# Patient Record
Sex: Male | Born: 1960 | Race: Black or African American | Hispanic: No | Marital: Married | Smoking: Current every day smoker
Health system: Southern US, Community
[De-identification: ages and names within clinical notes are randomized; demographics above are authoritative.]

## PROBLEM LIST (undated history)

## (undated) HISTORY — PX: FOOT SURGERY: SHX648

## (undated) HISTORY — PX: INGUINAL HERNIA REPAIR: SUR1180

---

## 1998-01-03 ENCOUNTER — Ambulatory Visit: Admission: RE | Admit: 1998-01-03 | Payer: Self-pay | Source: Ambulatory Visit | Admitting: Urology

## 1999-01-11 ENCOUNTER — Emergency Department (HOSPITAL_COMMUNITY): Admission: EM | Admit: 1999-01-11 | Discharge: 1999-01-11 | Payer: Self-pay | Admitting: Emergency Medicine

## 1999-09-13 ENCOUNTER — Emergency Department (HOSPITAL_COMMUNITY): Admission: EM | Admit: 1999-09-13 | Discharge: 1999-09-13 | Payer: Self-pay | Admitting: Emergency Medicine

## 2000-11-21 ENCOUNTER — Ambulatory Visit: Admission: RE | Admit: 2000-11-21 | Payer: Self-pay | Source: Ambulatory Visit | Admitting: Gastroenterology

## 2005-06-15 ENCOUNTER — Emergency Department (HOSPITAL_COMMUNITY): Admission: EM | Admit: 2005-06-15 | Discharge: 2005-06-15 | Payer: Self-pay | Admitting: Emergency Medicine

## 2007-02-01 ENCOUNTER — Ambulatory Visit: Admit: 2007-02-01 | Disposition: A | Discharge status: Home / Self Care | Payer: Self-pay | Source: Ambulatory Visit

## 2009-01-28 ENCOUNTER — Emergency Department (HOSPITAL_COMMUNITY): Admission: EM | Admit: 2009-01-28 | Discharge: 2009-01-28 | Payer: Self-pay | Admitting: Family Medicine

## 2010-03-01 ENCOUNTER — Emergency Department (HOSPITAL_COMMUNITY): Admission: EM | Admit: 2010-03-01 | Discharge: 2010-03-01 | Payer: Self-pay | Admitting: Emergency Medicine

## 2011-01-31 ENCOUNTER — Emergency Department (HOSPITAL_COMMUNITY)
Admission: EM | Admit: 2011-01-31 | Discharge: 2011-01-31 | Disposition: A | Discharge status: Home / Self Care | Payer: Medicare Other | Attending: Emergency Medicine | Admitting: Emergency Medicine

## 2011-01-31 DIAGNOSIS — M62838 Other muscle spasm: Secondary | ICD-10-CM | POA: Insufficient documentation

## 2011-01-31 DIAGNOSIS — M542 Cervicalgia: Secondary | ICD-10-CM | POA: Insufficient documentation

## 2011-08-11 ENCOUNTER — Ambulatory Visit
Admission: RE | Admit: 2011-08-11 | Disposition: A | Discharge status: Home / Self Care | Payer: Self-pay | Source: Ambulatory Visit | Attending: Family Medicine | Admitting: Family Medicine

## 2012-09-29 NOTE — Op Note (Unsigned)
DATE:                                     11/21/2000            PREOPERATIVE DIAGNOSIS:                   Hematochezia.            POSTOPERATIVE DIAGNOSIS:                  Friable external hemorrhoids                                                responsible for hematochezia.            OPERATION:                                Colonoscopy.            ANESTHESIA:                               Demerol 100 mg, Versed 5 mg IV.            PROCEDURE:                                The patient agreed to the risks      of the procedure including but not limited to infection, bleeding,      perforation, surgical repair, medication complications and a 3% miss rate      for the detection of colon cancer.  Then under continuous monitoring of      blood pressure, pulse oxygen saturation, and after IV sedation, the      colonoscope was inserted into the rectum under direct vision.  Friable      external hemorrhoids were noted responsible for hematochezia.  Retroflexed      view of the rectum revealed normal mucosa.  Upon straightening the      colonoscope, the remainder of the rectum, sigmoid, descending, transverse,      ascending and cecal regions of the colon revealed normal mucosa.  The cecum      was identified via identification of the ileocecal valve and appendiceal      orifice.  Upon slow and careful circumferential withdrawal of the      colonoscope, no masses, polyps or other lesions were noted.  The patient      tolerated the procedure well and was transferred to the recovery room in      stable condition without complications.  Withdrawal process of the      colonoscope from the cecum to the end of the procedure lasted      8 minutes.            IMPRESSION:                               Friable external hemorrhoids      responsible for hematochezia.  PLAN:      1.  Citrucel 2 tablets b.i.d.      2.  Colonoscopy in 10 years unless symptoms.      3.  The patient will call my office if bleeding recurs  regularly.            Dear Hessie Diener, thank you for involving me in the care of this very kind      gentleman.  It is always quite a pleasure to be involved in the care of      your patients.                                    LAL:2lm      Dictated:      11/21/2000 1:44 P             ______________________________      _____      Transcribed:   11/22/2000  2:43 P            Derrek Monaco, MD      Document Number: [id number]            CC:   Derrek Monaco, MD            Almyra Deforest, MD

## 2013-11-20 ENCOUNTER — Encounter (HOSPITAL_COMMUNITY): Payer: Self-pay | Admitting: Emergency Medicine

## 2013-11-20 ENCOUNTER — Emergency Department (HOSPITAL_COMMUNITY): Payer: Medicare Other

## 2013-11-20 ENCOUNTER — Emergency Department (HOSPITAL_COMMUNITY)
Admission: EM | Admit: 2013-11-20 | Discharge: 2013-11-20 | Disposition: A | Discharge status: Home / Self Care | Payer: Medicare Other | Attending: Emergency Medicine | Admitting: Emergency Medicine

## 2013-11-20 DIAGNOSIS — J40 Bronchitis, not specified as acute or chronic: Secondary | ICD-10-CM

## 2013-11-20 DIAGNOSIS — F17200 Nicotine dependence, unspecified, uncomplicated: Secondary | ICD-10-CM | POA: Insufficient documentation

## 2013-11-20 DIAGNOSIS — J209 Acute bronchitis, unspecified: Secondary | ICD-10-CM | POA: Insufficient documentation

## 2013-11-20 DIAGNOSIS — H5710 Ocular pain, unspecified eye: Secondary | ICD-10-CM | POA: Insufficient documentation

## 2013-11-20 LAB — POCT I-STAT, CHEM 8
BUN: 9 mg/dL (ref 6–23)
CALCIUM ION: 1.18 mmol/L (ref 1.12–1.23)
CHLORIDE: 99 meq/L (ref 96–112)
CREATININE: 1.1 mg/dL (ref 0.50–1.35)
Glucose, Bld: 102 mg/dL — ABNORMAL HIGH (ref 70–99)
HCT: 47 % (ref 39.0–52.0)
HEMOGLOBIN: 16 g/dL (ref 13.0–17.0)
POTASSIUM: 3.8 meq/L (ref 3.7–5.3)
Sodium: 138 mEq/L (ref 137–147)
TCO2: 26 mmol/L (ref 0–100)

## 2013-11-20 LAB — CBC WITH DIFFERENTIAL/PLATELET
Basophils Absolute: 0 10*3/uL (ref 0.0–0.1)
Basophils Relative: 1 % (ref 0–1)
EOS PCT: 3 % (ref 0–5)
Eosinophils Absolute: 0.1 10*3/uL (ref 0.0–0.7)
HEMATOCRIT: 42.7 % (ref 39.0–52.0)
HEMOGLOBIN: 14.8 g/dL (ref 13.0–17.0)
LYMPHS ABS: 0.7 10*3/uL (ref 0.7–4.0)
Lymphocytes Relative: 21 % (ref 12–46)
MCH: 31.5 pg (ref 26.0–34.0)
MCHC: 34.7 g/dL (ref 30.0–36.0)
MCV: 90.9 fL (ref 78.0–100.0)
Monocytes Absolute: 0.5 10*3/uL (ref 0.1–1.0)
Monocytes Relative: 15 % — ABNORMAL HIGH (ref 3–12)
Neutro Abs: 2 10*3/uL (ref 1.7–7.7)
Neutrophils Relative %: 60 % (ref 43–77)
PLATELETS: 139 10*3/uL — AB (ref 150–400)
RBC: 4.7 MIL/uL (ref 4.22–5.81)
RDW: 12.9 % (ref 11.5–15.5)
WBC: 3.2 10*3/uL — ABNORMAL LOW (ref 4.0–10.5)

## 2013-11-20 MED ORDER — ALBUTEROL SULFATE HFA 108 (90 BASE) MCG/ACT IN AERS: 2.0000 | INHALATION_SPRAY | RESPIRATORY_TRACT | Status: DC

## 2013-11-20 MED ORDER — PREDNISONE 20 MG PO TABS
60.0000 mg | ORAL_TABLET | Freq: Once | ORAL | Status: AC
  Administered 2013-11-20: 60 mg via ORAL
  Filled 2013-11-20: qty 3

## 2013-11-20 MED ORDER — ACETAMINOPHEN 325 MG PO TABS
650.0000 mg | ORAL_TABLET | Freq: Once | ORAL | Status: AC
  Administered 2013-11-20: 650 mg via ORAL
  Filled 2013-11-20: qty 2

## 2013-11-20 MED ORDER — ALBUTEROL SULFATE (2.5 MG/3ML) 0.083% IN NEBU
5.0000 mg | INHALATION_SOLUTION | Freq: Once | RESPIRATORY_TRACT | Status: AC
  Administered 2013-11-20: 5 mg via RESPIRATORY_TRACT
  Filled 2013-11-20: qty 6

## 2013-11-20 MED ORDER — PREDNISONE 10 MG PO TABS: ORAL_TABLET | ORAL | Status: DC

## 2013-11-20 MED ORDER — AZITHROMYCIN 250 MG PO TABS: 250.0000 mg | ORAL_TABLET | Freq: Every day | ORAL | Status: DC

## 2013-11-20 NOTE — ED Provider Notes (Signed)
CSN: [id number]     Arrival date & time 11/20/13  0756 History   First MD Initiated Contact with Patient 11/20/13 0800     Chief Complaint  Patient presents with  . Cough     (Consider location/radiation/quality/duration/timing/severity/associated sxs/prior Treatment) HPI Tommy Barry is a 53 y.o. male who presents to emergency department complaining of cough and fever. Patient states that his symptoms began 2 days ago. Patient states it began with nasal congestion and cough. States nasal congestion improved, now just having productive cough with clear and brownish sputum. States now also having pain in the rib cage with coughing. He denies any known fever but states he has had chills. He denies any sore throat. He denies any headache, neck pain or stiffness, nausea, vomiting, diarrhea, abdominal pain. He states he has pain with behind his eyes. He denies any shortness of breath. He tried to take a (NyQuil for his symptoms with no relief. He is a smoker, denies any other medical problems. He does not take any other medications. He states nothing makes his symptoms better or worse.  History reviewed. No pertinent past medical history. Past Surgical History  Procedure Laterality Date  . Foot surgery      right   No family history on file. History  Substance Use Topics  . Smoking status: Current Every Day Smoker    Types: Cigarettes  . Smokeless tobacco: Not on file  . Alcohol Use: Yes    Review of Systems  Constitutional: Negative for fever and chills.  HENT: Positive for congestion. Negative for facial swelling, mouth sores, sinus pressure, sore throat and trouble swallowing.   Eyes: Positive for pain. Negative for photophobia.  Respiratory: Positive for cough. Negative for chest tightness and shortness of breath.   Cardiovascular: Positive for chest pain. Negative for palpitations and leg swelling.  Gastrointestinal: Negative for nausea, vomiting, abdominal pain, diarrhea and  abdominal distention.  Genitourinary: Negative for dysuria, urgency, frequency and hematuria.  Musculoskeletal: Negative for arthralgias, myalgias, neck pain and neck stiffness.  Skin: Negative for rash.  Allergic/Immunologic: Negative for immunocompromised state.  Neurological: Negative for dizziness, weakness, light-headedness, numbness and headaches.      Allergies  Shellfish allergy  Home Medications   Current Outpatient Rx  Name  Route  Sig  Dispense  Refill  . Pseudoeph-Doxylamine-DM-APAP (NYQUIL PO)   Oral   Take 2 tablets by mouth every 4 (four) hours as needed (cold).         . Pseudoephedrine-APAP-DM (DAYQUIL PO)   Oral   Take 30 mLs by mouth daily as needed (cold).          BP 138/90  Pulse 102  Temp(Src) 101.2 F (38.4 C) (Oral)  Resp 30  Ht 6\' 4"  (1.93 m)  Wt 160 lb (72.576 kg)  BMI 19.48 kg/m2  SpO2 97% Physical Exam  Nursing note and vitals reviewed. Constitutional: He is oriented to person, place, and time. He appears well-developed and well-nourished. No distress.  HENT:  Head: Normocephalic and atraumatic.  Right Ear: External ear normal.  Left Ear: External ear normal.  Nose: Nose normal.  Mouth/Throat: Oropharynx is clear and moist.  Eyes: Conjunctivae are normal.  Neck: Neck supple.  Cardiovascular: Normal rate, regular rhythm and normal heart sounds.   Pulmonary/Chest: Effort normal. No respiratory distress. He has no wheezes. He has no rales.  Abdominal: Soft. Bowel sounds are normal. He exhibits no distension. There is no tenderness. There is no rebound.  Musculoskeletal: He exhibits no  edema.  Neurological: He is alert and oriented to person, place, and time.  Skin: Skin is warm and dry.    ED Course  Procedures (including critical care time) Labs Review Labs Reviewed  CBC WITH DIFFERENTIAL - Abnormal; Notable for the following:    WBC 3.2 (*)    Platelets 139 (*)    Monocytes Relative 15 (*)    All other components within  normal limits  POCT I-STAT, CHEM 8 - Abnormal; Notable for the following:    Glucose, Bld 102 (*)    All other components within normal limits   Imaging Review Dg Chest 2 View  11/20/2013   CLINICAL DATA:  Cough  EXAM: CHEST  2 VIEW  COMPARISON:  None.  FINDINGS: Mild biapical pleural parenchymal scarring. Large retrosternal clear space with suspicion for underlying COPD. Airway thickening is present, suggesting bronchitis or reactive airways disease. Cardiac and mediastinal margins appear normal. No pleural effusion.  IMPRESSION: 1. Airway thickening is present, suggesting bronchitis or reactive airways disease. 2. Emphysema.   Electronically Signed   By: Herbie Baltimore M.D.   On: 11/20/2013 09:06    EKG Interpretation   None       MDM   Final diagnoses:  Bronchitis     Patient with cough, congestion, fever for 2 days. Pt is febrile here at 101.2, tylenol given. His lungs are clear. CXR negative except for airway thickening, most likely bronchitis. . Pt non toxic appearing. He has no other medical problems. He is a smoker. Given prednisone 60mg  PO in ED and a neb, which helped. Pt is maintaining his oxygen sat above 96% on RA.  His HR and BP are normal. At this time, no indication for admission. At this time differential includes influenza, viral vs bacterial bronchitis. Doubt ACS or PE given clear upper respiratory symptoms, cough, fever. His CP is worsened with cough. Home with prednisone taper, inhaler, z-pack given a smoker and most likely COPD. Follow up with pcp.    Filed Vitals:   11/20/13 0803 11/20/13 0813 11/20/13 0915 11/20/13 1000  BP: 138/90   125/81  Pulse: 102  88 87  Temp: 101.2 F (38.4 C)   99.5 F (37.5 C)  TempSrc: Oral   Oral  Resp: 30  19 20   Height:  6\' 4"  (1.93 m)    Weight:  160 lb (72.576 kg)    SpO2: 97%  98% 98%      Daphna Lafuente A Alisan Dokes, PA-C 11/20/13 1606

## 2013-11-20 NOTE — ED Notes (Signed)
Care transferred, report received Marylene LandAngela, RN.

## 2013-11-20 NOTE — ED Notes (Signed)
Congested, non=-productive cough since Sunday,  Painful to his ribs due to coughing. Pain behind his eyes  Denies any N/V//D  Also having fever and chills.

## 2013-11-20 NOTE — Discharge Instructions (Signed)
Take tylenol every 6 hrs for fever. You can also take motrin for extra fever reducing. Use inhaler 2 puffs every 4 hrs as needed for cough and SOB. Take prdnisone as prescribed until all gone. Take zithromax as prescribed until all gone. Follow up with your doctor. Return here if not improving.    Bronchitis Bronchitis is inflammation of the airways that extend from the windpipe into the lungs (bronchi). The inflammation often causes mucus to develop, which leads to a cough. If the inflammation becomes severe, it may cause shortness of breath. CAUSES  Bronchitis may be caused by:   Viral infections.   Bacteria.   Cigarette smoke.   Allergens, pollutants, and other irritants.  SIGNS AND SYMPTOMS  The most common symptom of bronchitis is a frequent cough that produces mucus. Other symptoms include:  Fever.   Body aches.   Chest congestion.   Chills.   Shortness of breath.   Sore throat.  DIAGNOSIS  Bronchitis is usually diagnosed through a medical history and physical exam. Tests, such as chest X-rays, are sometimes done to rule out other conditions.  TREATMENT  You may need to avoid contact with whatever caused the problem (smoking, for example). Medicines are sometimes needed. These may include:  Antibiotics. These may be prescribed if the condition is caused by bacteria.  Cough suppressants. These may be prescribed for relief of cough symptoms.   Inhaled medicines. These may be prescribed to help open your airways and make it easier for you to breathe.   Steroid medicines. These may be prescribed for those with recurrent (chronic) bronchitis. HOME CARE INSTRUCTIONS  Get plenty of rest.   Drink enough fluids to keep your urine clear or pale yellow (unless you have a medical condition that requires fluid restriction). Increasing fluids may help thin your secretions and will prevent dehydration.   Only take over-the-counter or prescription medicines as  directed by your health care provider.  Only take antibiotics as directed. Make sure you finish them even if you start to feel better.  Avoid secondhand smoke, irritating chemicals, and strong fumes. These will make bronchitis worse. If you are a smoker, quit smoking. Consider using nicotine gum or skin patches to help control withdrawal symptoms. Quitting smoking will help your lungs heal faster.   Put a cool-mist humidifier in your bedroom at night to moisten the air. This may help loosen mucus. Change the water in the humidifier daily. You can also run the hot water in your shower and sit in the bathroom with the door closed for 5 10 minutes.   Follow up with your health care provider as directed.   Wash your hands frequently to avoid catching bronchitis again or spreading an infection to others.  SEEK MEDICAL CARE IF: Your symptoms do not improve after 1 week of treatment.  SEEK IMMEDIATE MEDICAL CARE IF:  Your fever increases.  You have chills.   You have chest pain.   You have worsening shortness of breath.   You have bloody sputum.  You faint.  You have lightheadedness.  You have a severe headache.   You vomit repeatedly. MAKE SURE YOU:   Understand these instructions.  Will watch your condition.  Will get help right away if you are not doing well or get worse. Document Released: 09/27/2005 Document Revised: 07/18/2013 Document Reviewed: 05/22/2013 [facility]ExitCare Patient Information 2014 [place], [place].

## 2013-11-23 NOTE — ED Provider Notes (Signed)
  Medical screening examination/treatment/procedure(s) were performed by non-physician practitioner and as supervising physician I was immediately available for consultation/collaboration.     Gerhard Munchobert Brittiany Wiehe, MD 11/23/13 [phone]

## 2014-04-09 ENCOUNTER — Emergency Department (HOSPITAL_COMMUNITY): Payer: Medicare Other

## 2014-04-09 ENCOUNTER — Emergency Department (HOSPITAL_COMMUNITY)
Admission: EM | Admit: 2014-04-09 | Discharge: 2014-04-09 | Disposition: A | Discharge status: Home / Self Care | Payer: Medicare Other | Attending: Emergency Medicine | Admitting: Emergency Medicine

## 2014-04-09 ENCOUNTER — Encounter (HOSPITAL_COMMUNITY): Payer: Self-pay | Admitting: Emergency Medicine

## 2014-04-09 DIAGNOSIS — G8929 Other chronic pain: Secondary | ICD-10-CM | POA: Insufficient documentation

## 2014-04-09 DIAGNOSIS — Z792 Long term (current) use of antibiotics: Secondary | ICD-10-CM | POA: Insufficient documentation

## 2014-04-09 DIAGNOSIS — M79609 Pain in unspecified limb: Secondary | ICD-10-CM | POA: Insufficient documentation

## 2014-04-09 DIAGNOSIS — M79644 Pain in right finger(s): Secondary | ICD-10-CM

## 2014-04-09 DIAGNOSIS — F17200 Nicotine dependence, unspecified, uncomplicated: Secondary | ICD-10-CM | POA: Insufficient documentation

## 2014-04-09 MED ORDER — MELOXICAM 7.5 MG PO TABS: 7.5000 mg | ORAL_TABLET | Freq: Every day | ORAL | Status: DC | PRN

## 2014-04-09 NOTE — Discharge Instructions (Signed)
Read the information below.  Use the prescribed medication as directed.  Please discuss all new medications with your pharmacist.  You may return to the Emergency Department at any time for worsening condition or any new symptoms that concern you.  If you develop uncontrolled pain, weakness or numbness of the extremity, severe discoloration of the skin, or you are unable to move your finger, return to the ER for a recheck.      Musculoskeletal Pain Musculoskeletal pain is muscle and boney aches and pains. These pains can occur in any part of the body. Your caregiver may treat you without knowing the cause of the pain. They may treat you if blood or urine tests, X-rays, and other tests were normal.  CAUSES There is often not a definite cause or reason for these pains. These pains may be caused by a type of germ (virus). The discomfort may also come from overuse. Overuse includes working out too hard when your body is not fit. Boney aches also come from weather changes. Bone is sensitive to atmospheric pressure changes. HOME CARE INSTRUCTIONS   Ask when your test results will be ready. Make sure you get your test results.  Only take over-the-counter or prescription medicines for pain, discomfort, or fever as directed by your caregiver. If you were given medications for your condition, do not drive, operate machinery or power tools, or sign legal documents for 24 hours. Do not drink alcohol. Do not take sleeping pills or other medications that may interfere with treatment.  Continue all activities unless the activities cause more pain. When the pain lessens, slowly resume normal activities. Gradually increase the intensity and duration of the activities or exercise.  During periods of severe pain, bed rest may be helpful. Lay or sit in any position that is comfortable.  Putting ice on the injured area.  Put ice in a bag.  Place a towel between your skin and the bag.  Leave the ice on for 15 to 20  minutes, 3 to 4 times a day.  Follow up with your caregiver for continued problems and no reason can be found for the pain. If the pain becomes worse or does not go away, it may be necessary to repeat tests or do additional testing. Your caregiver may need to look further for a possible cause. SEEK IMMEDIATE MEDICAL CARE IF:  You have pain that is getting worse and is not relieved by medications.  You develop chest pain that is associated with shortness or breath, sweating, feeling sick to your stomach (nauseous), or throw up (vomit).  Your pain becomes localized to the abdomen.  You develop any new symptoms that seem different or that concern you. MAKE SURE YOU:   Understand these instructions.  Will watch your condition.  Will get help right away if you are not doing well or get worse. Document Released: 09/27/2005 Document Revised: 12/20/2011 Document Reviewed: 06/01/2013 [facility]Exit[facility] Patient Information 2015 [place], [place]. This information is not intended to replace advice given to you by your health care provider. Make sure you discuss any questions you have with your health care provider.

## 2014-04-09 NOTE — ED Provider Notes (Signed)
CSN: [id number]     Arrival date & time 04/09/14  1106 History  This chart was scribed for non-physician practitioner, Trixie DredgeEmily West, PA-C, working with Ethelda ChickMartha K Linker, MD by Shari HeritageAisha Amuda, ED Scribe. This patient was seen in room TR05C/TR05C and the patient's care was started at 1:29 PM.   Chief Complaint  Patient presents with  . Finger Injury    The history is provided by the patient. No language interpreter was used.    HPI Comments: Tommy BaileyMark Erman is a 53 y.o. male who presents to the Emergency Department complaining of constant, moderate right index finger pain onset 2 weeks ago. He rates pain as 6/10. He describes pain as soreness. Pain is worse after use of hand for multiple hours. He has no taken any medications at home for symptom relief. 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 He reports intermittent tingling in his right hand. He denies numbness or weakness of the extremities, fever, chills, nausea or vomiting. He has no history of pulmonary disease and denies any other chronic medical conditions.    History reviewed. No pertinent past medical history. Past Surgical History  Procedure Laterality Date  . Foot surgery      right   No family history on file. History  Substance Use Topics  . Smoking status: Current Every Day Smoker    Types: Cigarettes  . Smokeless tobacco: Not on file  . Alcohol Use: Yes    Review of Systems  Constitutional: Negative for fever and chills.  Gastrointestinal: Negative for nausea and vomiting.  Musculoskeletal: Positive for arthralgias (right 2nd finger).  Neurological: Negative for weakness and numbness.  All other systems reviewed and are negative.     Allergies  Shellfish allergy  Home Medications   Prior to Admission medications   Medication Sig Start Date End Date Taking? Authorizing Rande Dario  azithromycin (ZITHROMAX) 250 MG tablet Take 1 tablet  (250 mg total) by mouth daily. Take first 2 tablets together, then 1 every day until finished. 11/20/13   Tatyana A Kirichenko, PA-C  predniSONE (DELTASONE) 10 MG tablet Take 5 tab day 1, take 4 tab day 2, take 3 tab day 3, take 2 tab day 4, and take 1 tab day 5 11/20/13   Tatyana A Kirichenko, PA-C  Pseudoeph-Doxylamine-DM-APAP (NYQUIL PO) Take 2 tablets by mouth every 4 (four) hours as needed (cold).    Historical Rosalva Neary, MD  Pseudoephedrine-APAP-DM (DAYQUIL PO) Take 30 mLs by mouth daily as needed (cold).    Historical Eudell Mcphee, MD   Triage Vitals: BP 140/89  Pulse 73  Temp(Src) 98 F (36.7 C) (Oral)  Resp 20  SpO2 98%  Physical Exam  Nursing note and vitals reviewed. Constitutional: He appears well-developed and well-nourished. No distress.  HENT:  Head: Normocephalic and atraumatic.  Neck: Neck supple.  Pulmonary/Chest: Effort normal.  Musculoskeletal:  Right upper extremity: Right forearm is nontender. Intact radial pulse on the right. Capillary refill less than 2 seconds throughout. Full active ROM of all digits, except for the DIP of the 2nd digit which does not bend chronically. Mild tenderness to the palmar and dorsal aspects of the 2nd digit starting at the mid metacarpal, worse over the MCP. No edema, erythema or warmth.    Neurological: He is alert.  Skin: He is not diaphoretic.    ED Course  Procedures (including critical care time) DIAGNOSTIC STUDIES: Oxygen Saturation is 98%  on room air, normal by my interpretation.    COORDINATION OF CARE: 1:33 PM- Patient informed of current plan for treatment and evaluation and agrees with plan at this time.   Imaging Review Dg Hand Complete Right  04/09/2014   CLINICAL DATA:  Right hand pain  EXAM: RIGHT HAND - COMPLETE 3+ VIEW  COMPARISON:  None.  FINDINGS: There is no evidence of fracture or dislocation. There is a small osseous protuberance along the dorsal aspect of the tuft of the right third digit. There is no evidence of  arthropathy or other focal bone abnormality. Soft tissues are unremarkable.  IMPRESSION: No acute osseous injury of the right hand.   Electronically Signed   By: Elige KoHetal  Patel   On: 04/09/2014 13:06    MDM   Final diagnoses:  Finger pain, right    Pt with chronic finger pain on right hand, worse with repetitive movements at work.  Suspect tendonitis.  No e/o infection.  No specific injury of hand.  Neurovascularly intact.  D/C home with mobic, hand surgery follow up.  Discussed result, findings, treatment, and follow up  with patient.  Pt given return precautions.  Pt verbalizes understanding and agrees with plan.      I personally performed the services described in this documentation, which was scribed in my presence. The recorded information has been reviewed and is accurate.   Trixie Dredgemily West, PA-C 04/09/14 1538

## 2014-04-09 NOTE — ED Notes (Signed)
Pt reports right index finger pain for several weeks. Pushes a cart for a living. ROM intact

## 2014-04-12 NOTE — ED Provider Notes (Signed)
Medical screening examination/treatment/procedure(s) were performed by non-physician practitioner and as supervising physician I was immediately available for consultation/collaboration.   EKG Interpretation None       Ethelda ChickMartha K Linker, MD 04/12/14 [phone]

## 2015-02-16 IMAGING — CR DG HAND COMPLETE 3+V*R*
3 series · 3 of 3 positions shown · non-contrast
Comparison: None.

CLINICAL DATA: Right hand pain

EXAM:
RIGHT HAND - COMPLETE 3+ VIEW

[x hand pa right]
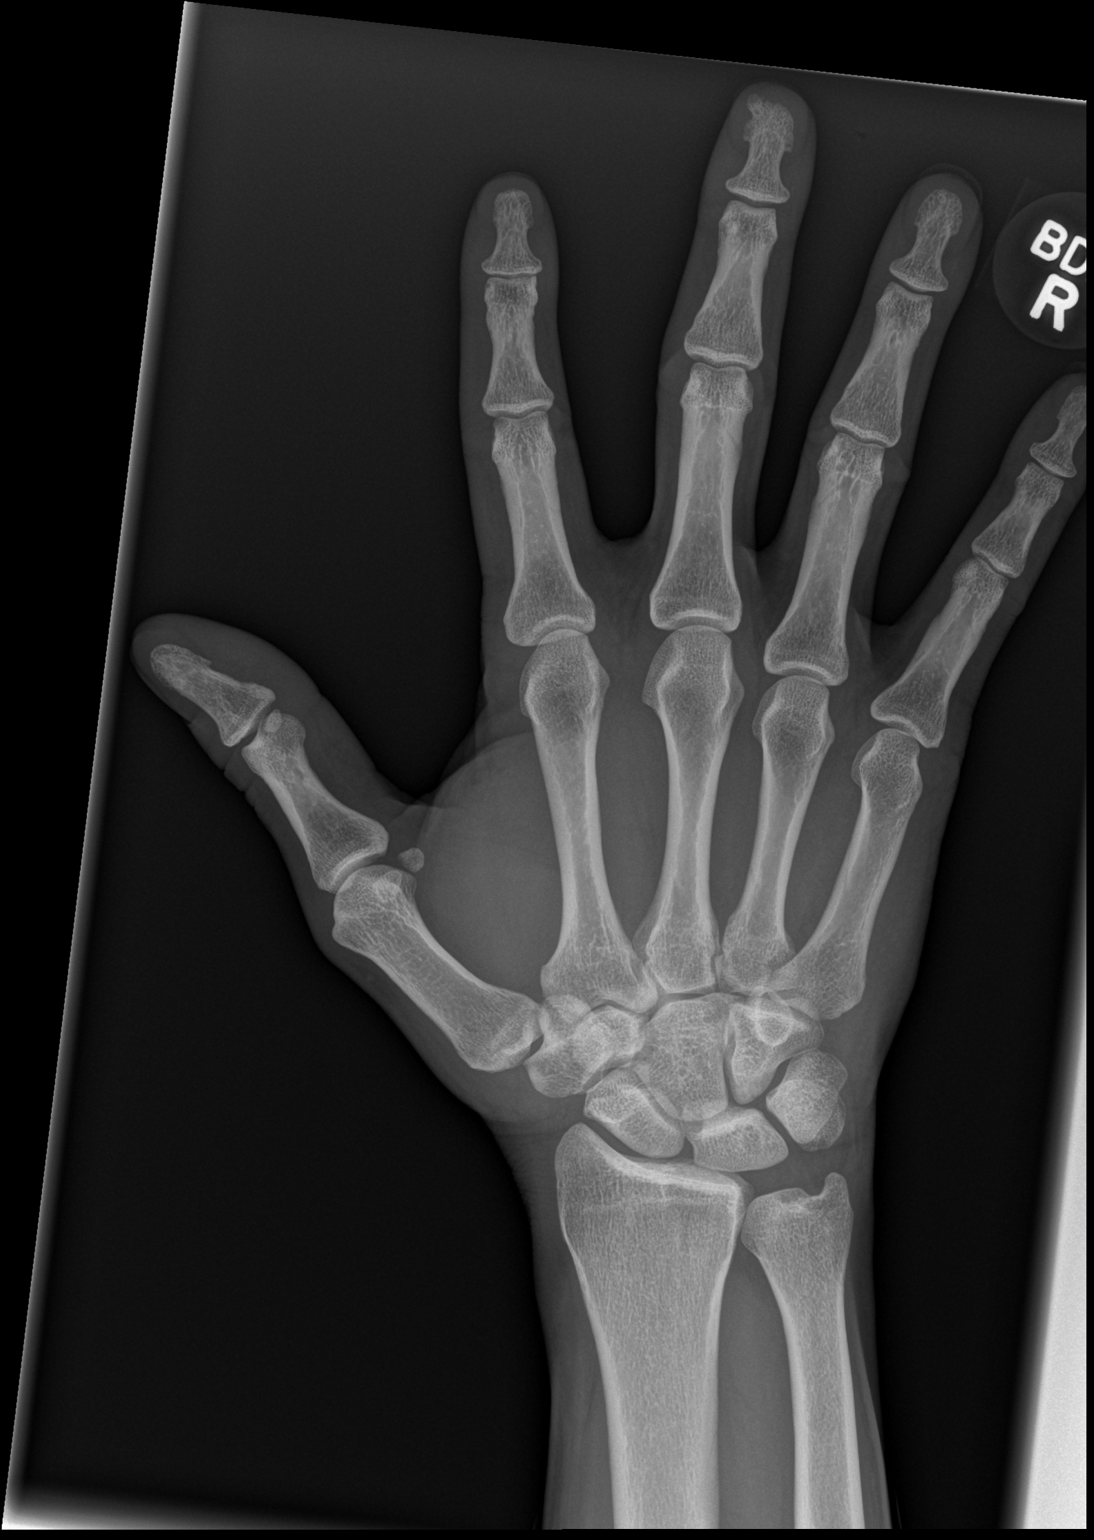

[x hand obl right]
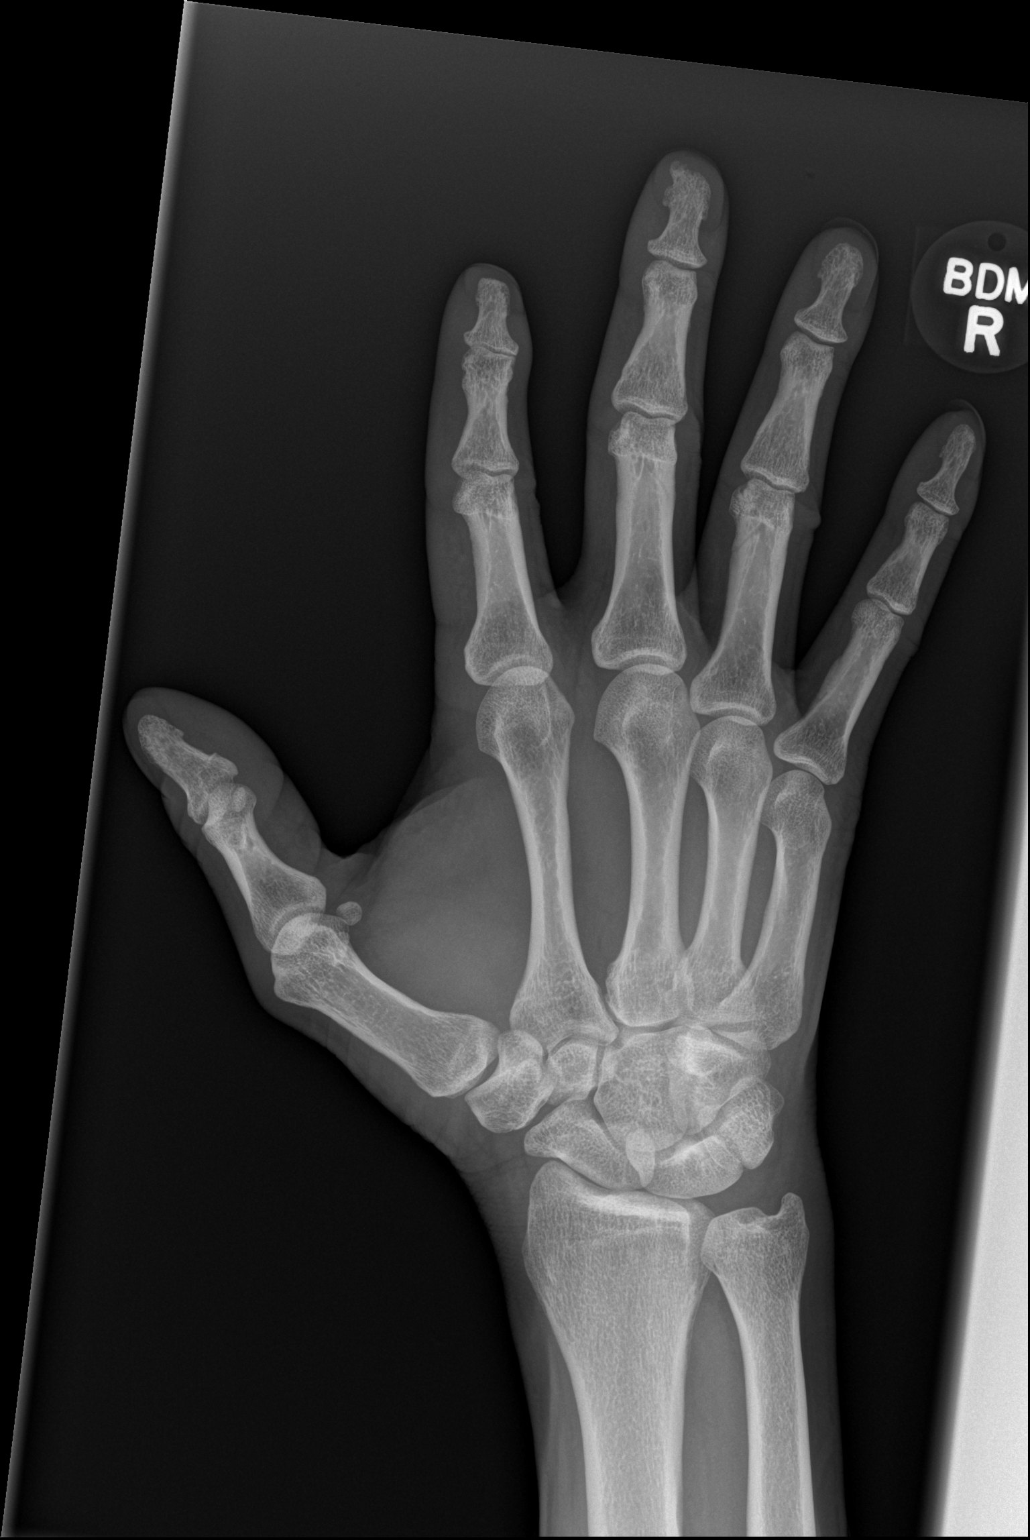

[x hand lat right]
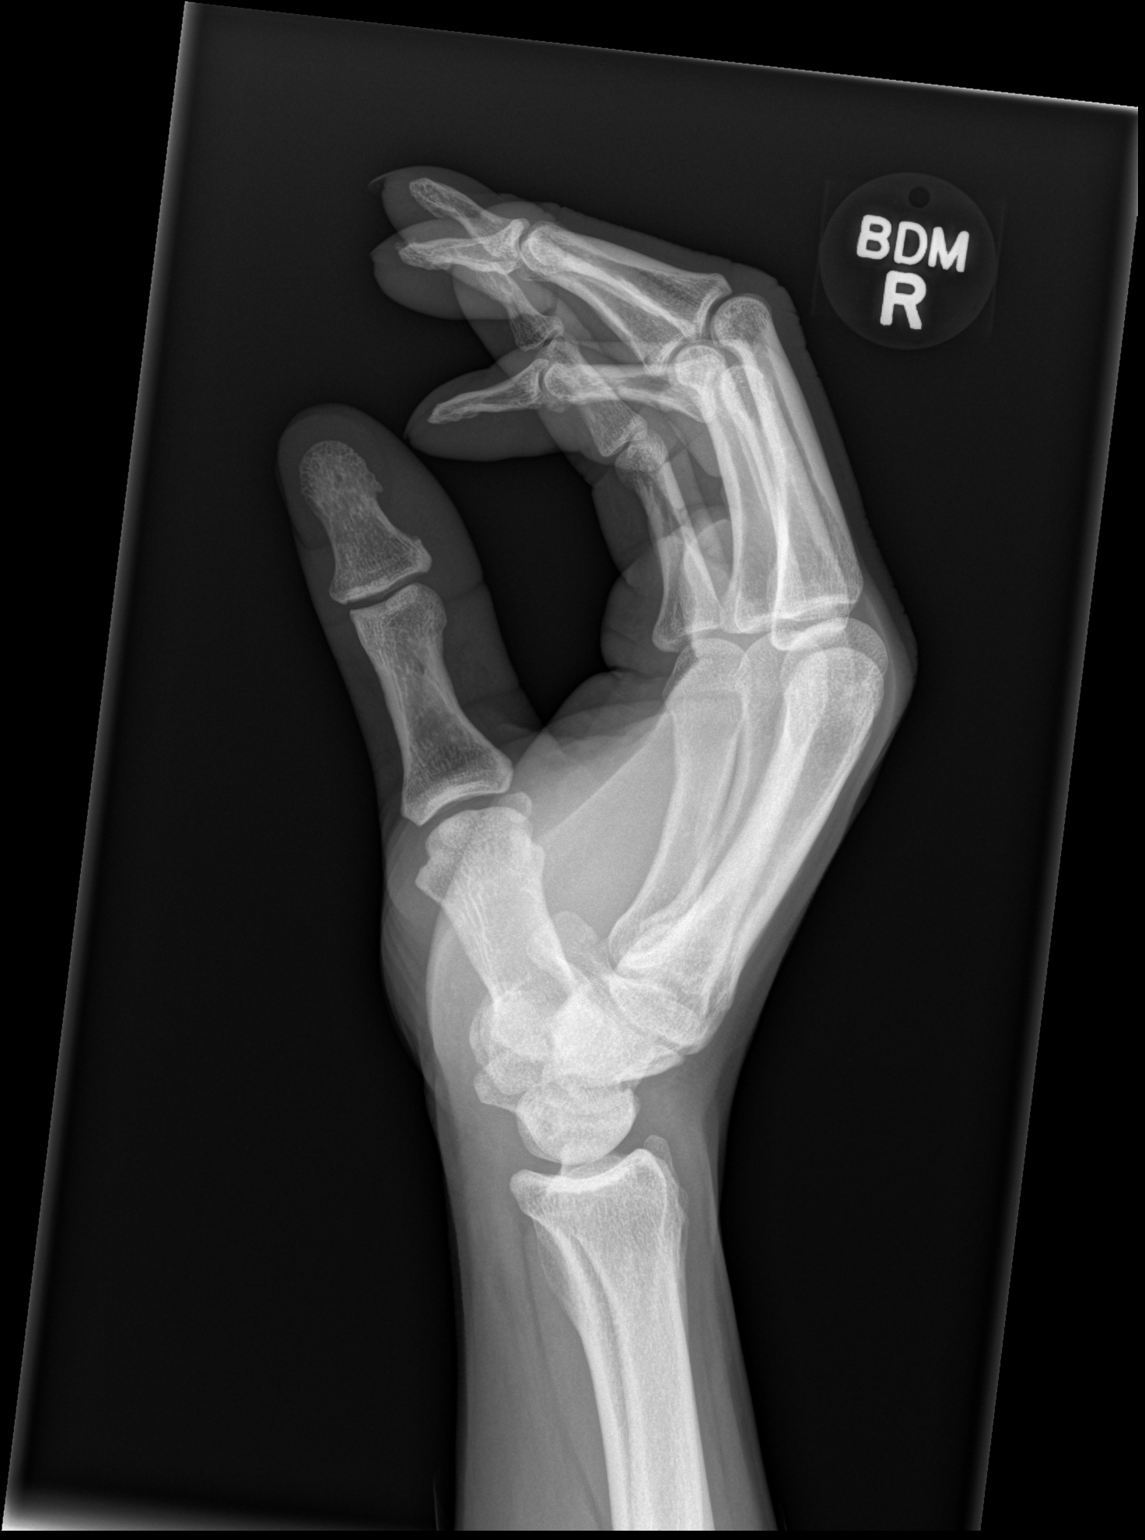

[3 of 3 positions shown; findings below may reference images not displayed]

FINDINGS: There is no evidence of fracture or dislocation. There is a small
osseous protuberance along the dorsal aspect of the tuft of the
right third digit. There is no evidence of arthropathy or other
focal bone abnormality. Soft tissues are unremarkable.
IMPRESSION: No acute osseous injury of the right hand.

## 2017-06-28 ENCOUNTER — Emergency Department (HOSPITAL_COMMUNITY)
Admission: EM | Admit: 2017-06-28 | Discharge: 2017-06-28 | Disposition: A | Discharge status: Home / Self Care | Payer: Medicare Other | Attending: Emergency Medicine | Admitting: Emergency Medicine

## 2017-06-28 ENCOUNTER — Encounter (HOSPITAL_COMMUNITY): Payer: Self-pay | Admitting: *Deleted

## 2017-06-28 DIAGNOSIS — F17210 Nicotine dependence, cigarettes, uncomplicated: Secondary | ICD-10-CM | POA: Diagnosis not present

## 2017-06-28 DIAGNOSIS — M25521 Pain in right elbow: Secondary | ICD-10-CM | POA: Diagnosis not present

## 2017-06-28 DIAGNOSIS — M791 Myalgia: Secondary | ICD-10-CM | POA: Diagnosis not present

## 2017-06-28 DIAGNOSIS — M2550 Pain in unspecified joint: Secondary | ICD-10-CM | POA: Diagnosis not present

## 2017-06-28 MED ORDER — PREDNISONE 10 MG PO TABS: 40.0000 mg | ORAL_TABLET | Freq: Every day | ORAL | 0 refills | Status: AC

## 2017-06-28 NOTE — Discharge Instructions (Signed)
Please read attached information regarding your condition. Take prednisone 40 mg for 5 days. Follow-up at [facility] for further evaluation. Return to ED for injuries, falls, numbness in arm, fevers, increased joint swelling or signs of infection in joint.

## 2017-06-28 NOTE — ED Triage Notes (Signed)
To ED for eval of right elbow ache and weakness for the past 2 weeks. Ache runs from fingers to shoulder. Pt able to move arm without difficulty in triage. No neuro deficits noted.

## 2017-06-28 NOTE — ED Provider Notes (Signed)
[facility]-EMERGENCY DEPT Provider Note   CSN: [id number] Arrival date & time: 06/28/17  1116     History   Chief Complaint Chief Complaint  Patient presents with  . Elbow Pain    HPI Tommy Barry is a 56 y.o. male.  HPI Patient presents to ED for evaluation of right arm pain for the past 6 weeks. States that the symptoms have gotten worse in the past few days. He reports pain and aches throughout the entire arm. 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 He denies any medications prior to arrival to help with pain. He denies any falls, injuries, numbness, weakness, prior fracture, dislocation or procedure in the area. He denies any chest pain, shortness of breath, abdominal pain.  History reviewed. No pertinent past medical history.  There are no active problems to display for this patient.   Past Surgical History:  Procedure Laterality Date  . FOOT SURGERY     right       Home Medications    Prior to Admission medications   Medication Sig Start Date End Date Taking? Authorizing Provider  azithromycin (ZITHROMAX) 250 MG tablet Take 1 tablet (250 mg total) by mouth daily. Take first 2 tablets together, then 1 every day until finished. 11/20/13   Kirichenko, Lemont Fillers, PA-C  meloxicam (MOBIC) 7.5 MG tablet Take 1 tablet (7.5 mg total) by mouth daily as needed for pain. 04/09/14   Trixie Dredge, PA-C  predniSONE (DELTASONE) 10 MG tablet Take 4 tablets (40 mg total) by mouth daily. 06/28/17 07/03/17  Yasaman Kolek, PA-C  Pseudoeph-Doxylamine-DM-APAP (NYQUIL PO) Take 2 tablets by mouth every 4 (four) hours as needed (cold).    [provider]  Pseudoephedrine-APAP-DM (DAYQUIL PO) Take 30 mLs by mouth daily as needed (cold).    [provider]    Family History No family history on file.  Social History Social History  Substance Use Topics  . Smoking status: Current Every Day Smoker    Types:  Cigarettes  . Smokeless tobacco: Not on file  . Alcohol use Yes     Allergies   Shellfish allergy   Review of Systems Review of Systems  Constitutional: Negative for chills and fever.  Musculoskeletal: Positive for arthralgias and myalgias. Negative for back pain, gait problem, joint swelling and neck pain.  Skin: Negative for pallor and wound.  Neurological: Negative for facial asymmetry, weakness, numbness and headaches.     Physical Exam Updated Vital Signs BP (!) 160/92   Pulse 71   Temp 98.2 F (36.8 C) (Oral)   Resp 16   SpO2 100%   Physical Exam  Constitutional: He appears well-developed and well-nourished. No distress.  Patient is nontoxic-appearing and in no acute distress.  HENT:  Head: Normocephalic and atraumatic.  Eyes: Conjunctivae and EOM are normal. No scleral icterus.  Neck: Normal range of motion.  Pulmonary/Chest: Effort normal. No respiratory distress.  Musculoskeletal: Normal range of motion. He exhibits tenderness. He exhibits no edema or deformity.       Arms: Tenderness to palpation of the entire right arm. No visible deformity, edema, wound or rash noted. Sensation intact to light touch. Equal grip strength bilaterally. Full active and passive range of motion of the shoulder, elbow and wrist of the right side.strength 5/5 in bilateral upper extremities. Negative empty can test. Negative Tinel's and Phalen's test.  Neurological: He is alert.  Skin: No rash  noted. He is not diaphoretic.  Psychiatric: He has a normal mood and affect.  Nursing note and vitals reviewed.    ED Treatments / Results  Labs (all labs ordered are listed, but only abnormal results are displayed) Labs Reviewed - No data to display  EKG  EKG Interpretation None       Radiology No results found.  Procedures Procedures (including critical care time)  Medications Ordered in ED Medications - No data to display   Initial Impression / Assessment and Plan / ED  Course  I have reviewed the triage vital signs and the nursing notes.  Pertinent labs & imaging results that were available during my care of the patient were reviewed by me and considered in my medical decision making (see chart for details).     Patient presents to ED for evaluation of right arm pain and aches that have been going on for the past 6 weeks. He does push carts at work and is unsure if this is causing him to have the pain, and he has been doing this for several years. He denies any weakness, numbness, injuries or falls. On physical exam there is diffuse tenderness to palpation of the arm. No visible deformity, rashes. Strength 5/5 in bilateral upper extremities, sensation intact to light touch. He has negative empty can test, negative Phalen's and Tinel's test. There is no appreciable weakness on physical examination. I suspect that his symptoms could be due to overuse of the extremity. There is no bony tenderness noted and joint abnormalities. We'll give prednisone to be taken to help with inflammation and advise patient to follow up at [facility] for further evaluation. He states that he will follow up with them regarding his blood pressure as well as he has no history of hypertension in the past. Patient appears stable for discharge at this time. Strict return precautions given.  Final Clinical Impressions(s) / ED Diagnoses   Final diagnoses:  Right elbow pain    New Prescriptions Discharge Medication List as of 06/28/2017  2:06 PM       Dietrich Pates, PA-C 06/28/17 1711    Melene Plan, DO 07/01/17 1842

## 2018-03-08 ENCOUNTER — Encounter (HOSPITAL_COMMUNITY): Payer: Self-pay | Admitting: *Deleted

## 2018-03-08 ENCOUNTER — Emergency Department (HOSPITAL_COMMUNITY): Payer: Medicare Other

## 2018-03-08 ENCOUNTER — Emergency Department (HOSPITAL_COMMUNITY)
Admission: EM | Admit: 2018-03-08 | Discharge: 2018-03-08 | Disposition: A | Discharge status: Home / Self Care | Payer: Medicare Other | Attending: Emergency Medicine | Admitting: Emergency Medicine

## 2018-03-08 DIAGNOSIS — Z79899 Other long term (current) drug therapy: Secondary | ICD-10-CM | POA: Insufficient documentation

## 2018-03-08 DIAGNOSIS — S20211A Contusion of right front wall of thorax, initial encounter: Secondary | ICD-10-CM | POA: Diagnosis present

## 2018-03-08 DIAGNOSIS — Y999 Unspecified external cause status: Secondary | ICD-10-CM | POA: Insufficient documentation

## 2018-03-08 DIAGNOSIS — F17210 Nicotine dependence, cigarettes, uncomplicated: Secondary | ICD-10-CM | POA: Diagnosis not present

## 2018-03-08 DIAGNOSIS — Y92009 Unspecified place in unspecified non-institutional (private) residence as the place of occurrence of the external cause: Secondary | ICD-10-CM | POA: Insufficient documentation

## 2018-03-08 DIAGNOSIS — Y939 Activity, unspecified: Secondary | ICD-10-CM | POA: Diagnosis not present

## 2018-03-08 MED ORDER — TRAMADOL HCL 50 MG PO TABS: 50.0000 mg | ORAL_TABLET | Freq: Four times a day (QID) | ORAL | 0 refills | Status: DC | PRN

## 2018-03-08 MED ORDER — NAPROXEN 500 MG PO TABS: 500.0000 mg | ORAL_TABLET | Freq: Two times a day (BID) | ORAL | 0 refills | Status: DC

## 2018-03-08 MED ORDER — KETOROLAC TROMETHAMINE 30 MG/ML IJ SOLN
30.0000 mg | Freq: Once | INTRAMUSCULAR | Status: AC
  Administered 2018-03-08: 30 mg via INTRAMUSCULAR
  Filled 2018-03-08: qty 1
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
## 2019-09-11 DIAGNOSIS — U071 COVID-19: Secondary | ICD-10-CM

## 2019-09-11 HISTORY — DX: COVID-19: U07.1

## 2020-03-24 NOTE — Progress Notes (Signed)
[facility] [employer] [facility]                       Date of Exam: 03/25/2020 11:32 AM        Patient ID: Sean Rowe is a 59 y.o. male.  Attending Physician: Carlos Levering, NP        Chief Complaint:    Chief Complaint   Patient presents with    Annual Exam               HPI:    Here today for an annual physical exam. Overall feeling well at this time. Please see medical history/concerns listed below.      GENERAL HEALTH MAINTENANCE:  - Dietary habits: moderate diet   - Exercise routine: walking, physically active around the house  - Dental care: regular checkups, last visit was 4 months ago   - Sleep quality: 7hrs/night. Trouble staying asleep d/t urinating (pt drinks before he sleeps). Snores/no gasping   - PHQ-2 score: 2, will quit job soon and retire     VACCINE HISTORY:  - Tdap (every 10 years): 2011, due and amenable   - COVID-19 vaccine: Have not received but will get soon     After age 32:  - Shingrix: Will defer since TDAP today       SCREENING EXAMS:    STD screening: Declined     Cancer screening:   - Family history of cancer: No known family history of colon or prostate cancer  - Skin cancer screening: Wears sunscreen outside, used to have spots on his neck and had biopsies/freezing. No concerns now.     After age 33:   - Colon cancer screening: Had colonoscopy 10 years ago. Had cologuard done (please check [place]). Non malignant results with past testing     Born between 1945-1965:   - Hep C screening: amenable to discussion if EW recommends     After age 23:  - Prostate cancer screening: Amenable       MEDICAL PROBLEMS/COMPLAINTS:  Nocturia  - has been going on for a while  - Particularly worse if drinks after 7 pm or if has wine  - Gets up about once/night  - Denies burning, pain, blood in urine, dribbling, hesitancy  - Admits to slightly weakened stream    Ringing in ears  - Bilateral  - has been going on for a few years  - Has worked around loud machines  intermittently  - Slightly diminished hearing            Problem List:    There is no problem list on file for this patient.            Current Meds:    Outpatient Medications Marked as Taking for the 03/25/20 encounter (Office Visit) with Carlos Levering, NP   Medication Sig Dispense Refill    cetirizine (ZyrTEC) 10 MG tablet Take 10 mg by mouth daily      NON FORMULARY Tapioca gummies for sleep      VITAMIN D PO Take by mouth            Allergies:    Allergies   Allergen Reactions    Midazolam Nausea Only             Past Surgical History:    Past Surgical History:   Procedure Laterality Date    INGUINAL HERNIA REPAIR      both sides  Family History:    Family History   Problem Relation Age of Onset    COPD Father            Social History:    Social History     Tobacco Use    Smoking status: Never Smoker    Smokeless tobacco: Never Used   Haematologist Use: Never used   Substance Use Topics    Alcohol use: Yes     Alcohol/week: 9.0 standard drinks     Types: 3 Cans of beer, 3 Glasses of wine, 3 Shots of liquor per week    Drug use: Never          The following sections were reviewed this encounter by the provider:   Tobacco   Allergies   Meds   Problems   Med Hx   Surg Hx   Fam Hx              Vital Signs:    BP (!) 168/110 (BP Site: Left arm, Patient Position: Sitting, Cuff Size: Medium)    Pulse 90    Temp (!) 96.6 F (35.9 C) (Temporal)    Ht 1.791 m (5' 10.5")    Wt 84.8 kg (187 lb)    SpO2 99%    BMI 26.45 kg/m          ROS:    Review of Systems   Constitutional: Negative for appetite change, fatigue, fever and unexpected weight change.   HENT: Positive for hearing loss and tinnitus. Negative for congestion, sore throat and trouble swallowing.    Eyes: Negative for pain and visual disturbance.   Respiratory: Negative for apnea, cough, chest tightness, shortness of breath and wheezing.    Cardiovascular: Negative for chest pain, palpitations and leg swelling.   Gastrointestinal:  Negative for abdominal pain, blood in stool, constipation, diarrhea, nausea and vomiting.   Genitourinary: Positive for frequency. Negative for decreased urine volume, difficulty urinating, dysuria, hematuria and urgency.   Musculoskeletal: Negative for arthralgias, back pain and myalgias.   Skin: Negative for rash.   Neurological: Negative for dizziness, weakness and headaches.   Psychiatric/Behavioral: Negative for dysphoric mood. The patient is not nervous/anxious.               Physical Exam:    Physical Exam  Vitals and nursing note reviewed.   Constitutional:       Appearance: Normal appearance. He is well-developed and overweight.   HENT:      Head: Normocephalic.      Right Ear: Tympanic membrane, ear canal and external ear normal.      Left Ear: Tympanic membrane, ear canal and external ear normal.      Nose: Nose normal.      Mouth/Throat:      Mouth: Mucous membranes are moist.      Pharynx: Oropharynx is clear.   Eyes:      Extraocular Movements: Extraocular movements intact.      Pupils: Pupils are equal, round, and reactive to light.   Neck:      Thyroid: No thyroid mass, thyromegaly or thyroid tenderness.   Cardiovascular:      Rate and Rhythm: Normal rate and regular rhythm.      Heart sounds: S1 normal and S2 normal.   Pulmonary:      Effort: Pulmonary effort is normal.      Breath sounds: Normal breath sounds.   Abdominal:      General:  Abdomen is flat. Bowel sounds are normal.      Palpations: Abdomen is soft.      Tenderness: There is no abdominal tenderness.   Musculoskeletal:      Cervical back: Normal range of motion and neck supple.   Lymphadenopathy:      Cervical: No cervical adenopathy.   Skin:     General: Skin is warm and dry.   Neurological:      Mental Status: He is alert.   Psychiatric:         Mood and Affect: Mood normal.         Behavior: Behavior normal.         Thought Content: Thought content normal.         Judgment: Judgment normal.              Assessment/Plan:    1. Wellness  examination  - Comprehensive metabolic panel  - Lipid panel  - Recommend 150 minutes activity per week  - Advised on diet high in vegetables, lean protein, low in fat and sugar  - Follow up annually    2. Immunization due  - Tdap vaccine greater than or equal to 7yo IM    3. Screening for prostate cancer  - PSA    4. Screening for colon cancer  - Cologuard    5. Benign prostatic hyperplasia with nocturia  - tamsulosin (Flomax) 0.4 MG Cap; Take 1 capsule (0.4 mg total) by mouth Daily after dinner  Dispense: 30 capsule; Refill: 0  - Limit caffeine and alcohol  - Limit fluid intake close to bedtime  - Continue efforts to maintain normal weight - exercise, diet modification  - Will do trial of flomax  - Follow up 1 month    6. Elevated blood pressure reading  - Patient reports home values stable  - Recommend start checking daily  - Advised on low sodium diet  - Very elevated today, we will check with pt this week to check home values, if elevation persists will start medication  - Will bring home values to 1 month follow up    7. Tinnitus of both ears  - Referral to ENT - EXTERNAL                  Follow-up:    No follow-ups on file.         Carlos Levering, NP

## 2020-03-25 ENCOUNTER — Ambulatory Visit (INDEPENDENT_AMBULATORY_CARE_PROVIDER_SITE_OTHER): Payer: Commercial Managed Care - POS | Admitting: Family

## 2020-03-25 ENCOUNTER — Encounter (INDEPENDENT_AMBULATORY_CARE_PROVIDER_SITE_OTHER): Payer: Self-pay | Admitting: Family

## 2020-03-25 VITALS — BP 168/110 | HR 90 | Temp 96.6°F | Ht 70.5 in | Wt 187.0 lb

## 2020-03-25 DIAGNOSIS — Z125 Encounter for screening for malignant neoplasm of prostate: Secondary | ICD-10-CM

## 2020-03-25 DIAGNOSIS — Z0000 Encounter for general adult medical examination without abnormal findings: Secondary | ICD-10-CM

## 2020-03-25 DIAGNOSIS — R351 Nocturia: Secondary | ICD-10-CM

## 2020-03-25 DIAGNOSIS — Z1211 Encounter for screening for malignant neoplasm of colon: Secondary | ICD-10-CM

## 2020-03-25 DIAGNOSIS — H9313 Tinnitus, bilateral: Secondary | ICD-10-CM

## 2020-03-25 DIAGNOSIS — R030 Elevated blood-pressure reading, without diagnosis of hypertension: Secondary | ICD-10-CM

## 2020-03-25 DIAGNOSIS — Z23 Encounter for immunization: Secondary | ICD-10-CM

## 2020-03-25 DIAGNOSIS — N401 Enlarged prostate with lower urinary tract symptoms: Secondary | ICD-10-CM

## 2020-03-25 MED ORDER — TAMSULOSIN HCL 0.4 MG PO CAPS
0.4000 mg | ORAL_CAPSULE | Freq: Every day | ORAL | 0 refills | Status: DC
Start: 2020-03-25 — End: 2020-04-23

## 2020-03-26 ENCOUNTER — Encounter (INDEPENDENT_AMBULATORY_CARE_PROVIDER_SITE_OTHER): Payer: Self-pay | Admitting: Family

## 2020-03-27 ENCOUNTER — Encounter (INDEPENDENT_AMBULATORY_CARE_PROVIDER_SITE_OTHER): Payer: Self-pay

## 2020-03-29 LAB — COMPREHENSIVE METABOLIC PANEL
ALT: 14 IU/L (ref 0–44)
AST (SGOT): 17 IU/L (ref 0–40)
African American eGFR: 110 mL/min/{1.73_m2} (ref >59)
Albumin/Globulin Ratio: 1.8 (ref 1.2–2.2)
Albumin: 4.8 g/dL (ref 3.8–4.9)
Alkaline Phosphatase: 50 IU/L (ref 48–121)
BUN / Creatinine Ratio: 15 (ref 10–24)
BUN: 13 mg/dL (ref 8–27)
Bilirubin, Total: 0.8 mg/dL (ref 0.0–1.2)
CO2: 20 mmol/L (ref 20–29)
Calcium: 9.4 mg/dL (ref 8.6–10.2)
Chloride: 104 mmol/L (ref 96–106)
Creatinine: 0.84 mg/dL (ref 0.76–1.27)
Globulin, Total: 2.7 g/dL (ref 1.5–4.5)
Glucose: 96 mg/dL (ref 65–99)
Potassium: 4.5 mmol/L (ref 3.5–5.2)
Protein, Total: 7.5 g/dL (ref 6.0–8.5)
Sodium: 140 mmol/L (ref 134–144)
non-African American eGFR: 95 mL/min/{1.73_m2} (ref >59)

## 2020-03-29 LAB — LIPID PANEL
Cholesterol / HDL Ratio: 3.3 ratio (ref 0.0–5.0)
Cholesterol: 240 mg/dL — ABNORMAL HIGH (ref 100–199)
HDL: 73 mg/dL (ref >39)
LDL Chol Calculated (NIH): 146 mg/dL — ABNORMAL HIGH (ref 0–99)
Triglycerides: 119 mg/dL (ref 0–149)
VLDL Calculated: 21 mg/dL (ref 5–40)

## 2020-03-29 LAB — PSA: Prostate Specific Antigen, Total: 2.1 ng/mL (ref 0.0–4.0)

## 2020-03-31 ENCOUNTER — Encounter (INDEPENDENT_AMBULATORY_CARE_PROVIDER_SITE_OTHER): Payer: Self-pay

## 2020-03-31 NOTE — Progress Notes (Signed)
Spoke with pt and relayed results. Pt was wondering if it is okay to try adjusting diet/exercise first, and recheck cholesterol values in 1 month. Please let me know.     I will send [facility] diet list via my chart as well.

## 2020-03-31 NOTE — Progress Notes (Signed)
Sent pt a My Chart message to notify him.

## 2020-04-02 ENCOUNTER — Ambulatory Visit: Payer: Medicare Other | Admitting: Podiatry

## 2020-04-07 ENCOUNTER — Other Ambulatory Visit: Payer: Self-pay | Admitting: Podiatry

## 2020-04-07 ENCOUNTER — Ambulatory Visit (INDEPENDENT_AMBULATORY_CARE_PROVIDER_SITE_OTHER): Payer: Medicare Other | Admitting: Podiatry

## 2020-04-07 ENCOUNTER — Other Ambulatory Visit: Payer: Self-pay

## 2020-04-07 ENCOUNTER — Ambulatory Visit (INDEPENDENT_AMBULATORY_CARE_PROVIDER_SITE_OTHER): Payer: Medicare Other

## 2020-04-07 DIAGNOSIS — M2041 Other hammer toe(s) (acquired), right foot: Secondary | ICD-10-CM | POA: Diagnosis not present

## 2020-04-07 DIAGNOSIS — L989 Disorder of the skin and subcutaneous tissue, unspecified: Secondary | ICD-10-CM | POA: Diagnosis not present

## 2020-04-07 DIAGNOSIS — M2011 Hallux valgus (acquired), right foot: Secondary | ICD-10-CM

## 2020-04-07 DIAGNOSIS — M21611 Bunion of right foot: Secondary | ICD-10-CM

## 2020-04-07 NOTE — Progress Notes (Signed)
° °  Subjective: 59 y.o. male presenting today for evaluation of right foot pain secondary to callus lesions to the plantar aspect of the foot.  Patient does state that he has been diagnosed in the past with bunions and hammertoes.  He is having very symptomatic calluses that been going on for several years.  He does have a history of foot surgery to the right foot but he continues to have pain and tenderness.  He has been putting padding in his shoes to offload the pressure.   Objective: Physical Exam General: The patient is alert and oriented x3 in no acute distress.  Dermatology: Skin is cool, dry and supple bilateral lower extremities. Negative for open lesions or macerations.  Hyperkeratotic callus tissue noted to the plantar aspect of the foot x4.  The both symptomatic lesions are subsecond MTPJ and plantar heel  Vascular: Palpable pedal pulses bilaterally. No edema or erythema noted. Capillary refill within normal limits.  Neurological: Epicritic and protective threshold grossly intact bilaterally.   Musculoskeletal Exam: Clinical evidence of bunion deformity noted to the respective foot. There is moderate pain on palpation range of motion of the first MPJ. Lateral deviation of the hallux noted consistent with hallux abductovalgus. Hammertoe contracture also noted on clinical exam to digits 2-5 of the right foot. Symptomatic pain on palpation and range of motion also noted to the metatarsal phalangeal joints of the respective hammertoe digits.    Radiographic Exam: Increased intermetatarsal angle greater than 15 with a hallux abductus angle greater than 30 noted on AP view. Moderate degenerative changes noted within the first MPJ. Contracture deformity also noted to the interphalangeal joints and MPJs of the digits of the respective hammertoes.    Assessment: 1. HAV w/ bunion deformity right 2. Hammertoe deformity right 3.  Preulcerative callus lesion subsecond MTPJ right 4.   Porokeratosis multiple right   Plan of Care:  1. Patient was evaluated. X-Rays reviewed. 2.  I explained today that I would like to see if we could improve the painful calluses conservatively 3.  Excisional debridement of the callus tissue was performed using a chisel blade without incident or bleeding.  Salicylic acid applied. 4.  Recommend Salinocaine ointment (provided) 2 times daily covered with a Band-Aid to the symptomatic lesions 5.  Return to clinic in 4 weeks    Felecia Shelling, DPM [facility]  Dr. Felecia Shelling, DPM    [address]                                        [place], [place] [address]                Office [phone]  Fax [phone]

## 2020-04-09 LAB — UNMAPPED LAB RESULTS: Cologuard Result: NEGATIVE

## 2020-04-10 ENCOUNTER — Other Ambulatory Visit (INDEPENDENT_AMBULATORY_CARE_PROVIDER_SITE_OTHER): Payer: Self-pay | Admitting: Family

## 2020-04-10 LAB — COLOGUARD: Cologuard Result: NEGATIVE

## 2020-04-23 ENCOUNTER — Other Ambulatory Visit (INDEPENDENT_AMBULATORY_CARE_PROVIDER_SITE_OTHER): Payer: Self-pay | Admitting: Family

## 2020-04-23 ENCOUNTER — Telehealth (INDEPENDENT_AMBULATORY_CARE_PROVIDER_SITE_OTHER): Payer: Self-pay | Admitting: Family

## 2020-04-23 DIAGNOSIS — R351 Nocturia: Secondary | ICD-10-CM

## 2020-04-23 DIAGNOSIS — N401 Enlarged prostate with lower urinary tract symptoms: Secondary | ICD-10-CM

## 2020-04-23 MED ORDER — TAMSULOSIN HCL 0.4 MG PO CAPS
0.4000 mg | ORAL_CAPSULE | Freq: Every day | ORAL | 0 refills | Status: DC
Start: 2020-04-23 — End: 2020-05-21

## 2020-04-23 NOTE — Telephone Encounter (Signed)
Refill request received by fax from [facility]. F/u scheduled for 05/01/20. Can you send temporary refill of flomax?Or should pt wait until appt?

## 2020-04-23 NOTE — Telephone Encounter (Signed)
Spoke with pt to notify him.

## 2020-04-23 NOTE — Telephone Encounter (Signed)
Refill sent over, thanks!

## 2020-04-28 ENCOUNTER — Encounter (INDEPENDENT_AMBULATORY_CARE_PROVIDER_SITE_OTHER): Payer: Self-pay | Admitting: Family

## 2020-04-30 NOTE — Progress Notes (Signed)
[facility] [place] [facility]                       Date of Exam: 05/01/2020 2:34 PM        Patient ID: Sean Rowe is a 59 y.o. male.  Attending Physician: Carlos Levering, NP        Chief Complaint:    Chief Complaint   Patient presents with    Hypertension               HPI:    1.  Blood Pressure follow up  - Has been monitoring blood pressure since last visit  - Last OV BP: 168/110   - reports average BP of 116-130's/70-80's (has log which shows this)  - denies CP, SOB, edema, dizziness  - for diet reports fruits, eggs, veggies, [place], chicken - also has been reading about the carnivore diet  - for exercise reports walking 5x/week, 2-3 miles at a time   - last eGFR: African American eGFR  non-African American eGFR       Date                     Value               Ref Range           Status                03/25/2020               95                  >59 mL/min/1.73     Final            ----------      2.  BPH  - Pt presents for follow up on Flomax   - taking tamsulosin 0.4mg  daily  - Has made "a little bit of a difference."   - "Not eliminated the problem but mitigated the problem"   - Previous symptoms nocturia and weakened stream  - Has noticed improvement 1/3 to 1/2   - Skipped a night and definitely noticed weaker stream and having to get up  - If has wine at night will get up more  - Typically only getting up once now  - Denies any side effects - denies dizziness, lightheadedness    3.  COVID antibody  - Would like antibody test  - Had COVID in December  - Has not had vaccine            Problem List:    There is no problem list on file for this patient.            Current Meds:    Outpatient Medications Marked as Taking for the 05/01/20 encounter (Office Visit) with Carlos Levering, NP   Medication Sig Dispense Refill    cetirizine (ZyrTEC) 10 MG tablet Take 10 mg by mouth daily      NON FORMULARY Tapioca gummies for sleep      tamsulosin (Flomax) 0.4 MG Cap Take 1 capsule  (0.4 mg total) by mouth Daily after dinner 30 capsule 0    VITAMIN D PO Take by mouth            Allergies:    Allergies   Allergen Reactions    Midazolam Nausea Only             Past Surgical  History:    Past Surgical History:   Procedure Laterality Date    INGUINAL HERNIA REPAIR      both sides            Family History:    Family History   Problem Relation Age of Onset    COPD Father            Social History:    Social History     Tobacco Use    Smoking status: Never Smoker    Smokeless tobacco: Never Used   Haematologist Use: Never used   Substance Use Topics    Alcohol use: Yes     Alcohol/week: 9.0 standard drinks     Types: 3 Glasses of wine, 3 Cans of beer, 3 Shots of liquor per week    Drug use: Never          The following sections were reviewed this encounter by the provider:   Tobacco   Allergies   Meds   Problems   Med Hx   Surg Hx   Fam Hx              Vital Signs:    BP (!) 146/92 (BP Site: Left arm, Patient Position: Sitting, Cuff Size: Medium)    Pulse 92    Temp 98.1 F (36.7 C) (Tympanic)    SpO2 98%          ROS:    Review of Systems   Constitutional: Negative for fatigue and unexpected weight change.   Eyes: Negative for visual disturbance.   Respiratory: Negative for cough, chest tightness and shortness of breath.    Cardiovascular: Negative for chest pain, palpitations and leg swelling.   Neurological: Negative for dizziness, weakness and light-headedness.              Physical Exam:    Physical Exam  Vitals and nursing note reviewed.   Constitutional:       Appearance: Normal appearance.   HENT:      Head: Normocephalic.   Eyes:      Extraocular Movements: Extraocular movements intact.   Cardiovascular:      Rate and Rhythm: Normal rate and regular rhythm.      Pulses: Normal pulses.   Pulmonary:      Effort: Pulmonary effort is normal.      Breath sounds: Normal breath sounds.   Musculoskeletal:      Cervical back: Neck supple.      Right lower leg: No edema.      Left  lower leg: No edema.   Skin:     General: Skin is warm and dry.   Neurological:      Mental Status: He is alert.   Psychiatric:         Mood and Affect: Mood normal.         Behavior: Behavior normal.              Assessment/Plan:    1. Benign prostatic hyperplasia with nocturia  - Improvement since starting flomax but is still having nocturia  - Will try increasing to 0.8 mg (pt still has 0.4 mg left so will double)  - Recommend he message me through my chart in 2 weeks and let me know if tolerating ok and if improved  - If works well we will continue on 0.8 mg  - Will reassess when we check his cholesterol in 2-3 months  2. Elevated blood pressure reading  - Home values are stable  - Pt is working on lifestyle modifications through diet and exercise  - Continue home monitoring, reducing sodium  - Contact office if >140s/90s    3. Immunity status testing  - SARS-CoV-2 Antibody, IgG  - Advised may not be covered  - Agrees to check given his diagnosis in December and has not received vaccine yet                Follow-up:    Return in about 2 months (around 07/02/2020).         Carlos Levering, NP

## 2020-05-01 ENCOUNTER — Ambulatory Visit (INDEPENDENT_AMBULATORY_CARE_PROVIDER_SITE_OTHER): Payer: Commercial Managed Care - POS | Admitting: Family

## 2020-05-01 ENCOUNTER — Encounter (INDEPENDENT_AMBULATORY_CARE_PROVIDER_SITE_OTHER): Payer: Self-pay | Admitting: Family

## 2020-05-01 VITALS — BP 146/92 | HR 92 | Temp 98.1°F

## 2020-05-01 DIAGNOSIS — N401 Enlarged prostate with lower urinary tract symptoms: Secondary | ICD-10-CM

## 2020-05-01 DIAGNOSIS — R351 Nocturia: Secondary | ICD-10-CM

## 2020-05-01 DIAGNOSIS — Z0184 Encounter for antibody response examination: Secondary | ICD-10-CM

## 2020-05-01 DIAGNOSIS — R030 Elevated blood-pressure reading, without diagnosis of hypertension: Secondary | ICD-10-CM

## 2020-05-02 LAB — SARS COV 2 ANTIBODY IGG: DiaSorin SARS-CoV-2 Ab, IgG: NEGATIVE

## 2020-05-14 ENCOUNTER — Encounter (INDEPENDENT_AMBULATORY_CARE_PROVIDER_SITE_OTHER): Payer: Self-pay | Admitting: Family

## 2020-05-19 ENCOUNTER — Encounter (INDEPENDENT_AMBULATORY_CARE_PROVIDER_SITE_OTHER): Payer: Self-pay | Admitting: Family

## 2020-05-19 ENCOUNTER — Ambulatory Visit: Payer: Medicare Other | Admitting: Podiatry

## 2020-05-21 ENCOUNTER — Other Ambulatory Visit (INDEPENDENT_AMBULATORY_CARE_PROVIDER_SITE_OTHER): Payer: Self-pay | Admitting: Family

## 2020-05-21 DIAGNOSIS — N401 Enlarged prostate with lower urinary tract symptoms: Secondary | ICD-10-CM

## 2020-05-21 DIAGNOSIS — R351 Nocturia: Secondary | ICD-10-CM

## 2020-05-21 MED ORDER — TAMSULOSIN HCL 0.4 MG PO CAPS
0.4000 mg | ORAL_CAPSULE | Freq: Every day | ORAL | 0 refills | Status: DC
Start: 2020-05-21 — End: 2020-05-21

## 2020-05-21 MED ORDER — TAMSULOSIN HCL 0.4 MG PO CAPS
0.8000 mg | ORAL_CAPSULE | Freq: Every day | ORAL | 0 refills | Status: DC
Start: 2020-05-21 — End: 2020-05-21

## 2020-08-12 ENCOUNTER — Other Ambulatory Visit (INDEPENDENT_AMBULATORY_CARE_PROVIDER_SITE_OTHER): Payer: Self-pay | Admitting: Family

## 2020-08-12 DIAGNOSIS — N401 Enlarged prostate with lower urinary tract symptoms: Secondary | ICD-10-CM

## 2020-08-12 DIAGNOSIS — R351 Nocturia: Secondary | ICD-10-CM

## 2021-06-08 ENCOUNTER — Other Ambulatory Visit (INDEPENDENT_AMBULATORY_CARE_PROVIDER_SITE_OTHER): Payer: Self-pay | Admitting: Family

## 2022-02-08 ENCOUNTER — Encounter: Payer: Self-pay | Admitting: Podiatry

## 2022-02-08 ENCOUNTER — Ambulatory Visit (INDEPENDENT_AMBULATORY_CARE_PROVIDER_SITE_OTHER): Payer: Medicare Other | Admitting: Podiatry

## 2022-02-08 DIAGNOSIS — M79675 Pain in left toe(s): Secondary | ICD-10-CM

## 2022-02-08 DIAGNOSIS — L989 Disorder of the skin and subcutaneous tissue, unspecified: Secondary | ICD-10-CM | POA: Diagnosis not present

## 2022-02-08 DIAGNOSIS — B351 Tinea unguium: Secondary | ICD-10-CM

## 2022-02-08 DIAGNOSIS — M79674 Pain in right toe(s): Secondary | ICD-10-CM

## 2022-02-08 NOTE — Progress Notes (Signed)
? ?  SUBJECTIVE ?Patient presents to office today complaining of elongated, thickened nails that cause pain while ambulating in shoes.  Patient is unable to trim their own nails.  Patient also suffers from pain associated to calluses.  He does have history of surgery to help alleviate the callus pressure but he continues to get recurrent calluses to the bilateral feet.  Right worse than the left.  Patient is here for further evaluation and treatment. ? ?No past medical history on file. ? ?Past Surgical History:  ?Procedure Laterality Date  ? FOOT SURGERY    ? right  ? ?Allergies  ?Allergen Reactions  ? Shellfish Allergy Swelling  ? ? ? ?OBJECTIVE ?General Patient is awake, alert, and oriented x 3 and in no acute distress. ?Derm Skin is dry and supple bilateral. Negative open lesions or macerations. Remaining integument unremarkable. Nails are tender, long, thickened and dystrophic with subungual debris, consistent with onychomycosis, 1-5 bilateral. No signs of infection noted.  Hyperkeratotic preulcerative callus tissue noted to the bilateral feet right worse than the left ?Vasc  DP and PT pedal pulses palpable bilaterally. Temperature gradient within normal limits.  ?Neuro Epicritic and protective threshold sensation grossly intact bilaterally.  ?Musculoskeletal Exam No symptomatic pedal deformities noted bilateral. Muscular strength within normal limits. ? ?ASSESSMENT ?1.  Pain due to onychomycosis of toenails both ?2.  Preulcerative calluses bilateral feet.  Right worse than the left ? ?PLAN OF CARE ?1. Patient evaluated today.  ?2. Instructed to maintain good pedal hygiene and foot care.  ?3. Mechanical debridement of nails 1-5 bilaterally performed using a nail nipper. Filed with dremel without incident.  ?4.  Excisional debridement of the hyperkeratotic preulcerative callus tissue was performed using a 312 scalpel without incident or bleeding as a courtesy for the patient.  ?5.  Patient wears OTC arch supports.   Continue.  Advised against going barefoot ?6.  Return to clinic in 3 mos. for routine foot care ? ? ?Edrick Kins, DPM ?[place] ? ?Dr. Edrick Kins, DPM  ?  ?[address]                                     ?[place], [place] [address]                ?Office [phone]  ?Fax [phone] ? ? ? ? ?

## 2022-05-17 ENCOUNTER — Ambulatory Visit (INDEPENDENT_AMBULATORY_CARE_PROVIDER_SITE_OTHER): Payer: Medicare Other | Admitting: Podiatry

## 2022-05-17 DIAGNOSIS — M79674 Pain in right toe(s): Secondary | ICD-10-CM | POA: Diagnosis not present

## 2022-05-17 DIAGNOSIS — B351 Tinea unguium: Secondary | ICD-10-CM

## 2022-05-17 DIAGNOSIS — L989 Disorder of the skin and subcutaneous tissue, unspecified: Secondary | ICD-10-CM

## 2022-05-17 DIAGNOSIS — M79675 Pain in left toe(s): Secondary | ICD-10-CM | POA: Diagnosis not present

## 2022-05-17 NOTE — Progress Notes (Signed)
   SUBJECTIVE Patient presents to office today for follow-up evaluation and routine foot care of toenails and symptomatic calluses especially to the right foot.  Patient states after last visit he did feel significant amount of relief but it slowly returned with the redevelopment of the calluses to the plantar aspect of the right foot.  No past medical history on file.  Past Surgical History:  Procedure Laterality Date   FOOT SURGERY     right   Allergies  Allergen Reactions   Shellfish Allergy Swelling     OBJECTIVE General Patient is awake, alert, and oriented x 3 and in no acute distress. Derm Skin is dry and supple bilateral. Negative open lesions or macerations. Remaining integument unremarkable. Nails are tender, long, thickened and dystrophic with subungual debris, consistent with onychomycosis, 1-5 bilateral. No signs of infection noted.  Hyperkeratotic preulcerative callus tissue noted to the bilateral feet right worse than the left Vasc  DP and PT pedal pulses palpable bilaterally. Temperature gradient within normal limits.  Neuro Epicritic and protective threshold sensation grossly intact bilaterally.  Musculoskeletal Exam No symptomatic pedal deformities noted bilateral. Muscular strength within normal limits.  ASSESSMENT 1.  Pain due to onychomycosis of toenails both 2.  Preulcerative calluses bilateral feet.  Right worse than the left  PLAN OF CARE 1. Patient evaluated today.  2. Instructed to maintain good pedal hygiene and foot care.  3. Mechanical debridement of nails 1-5 bilaterally performed using a nail nipper. Filed with dremel without incident.  4.  Excisional debridement of the hyperkeratotic preulcerative callus tissue was performed using a 312 scalpel without incident or bleeding as a courtesy for the patient.  5.  Patient wears OTC arch supports.  Continue.  Advised against going barefoot 6.  Return to clinic in 3 mos. for routine foot care   Jahnia Hewes M.  Traevion Poehler, DPM [facility]  Dr. Sung Renton M. Rashonda Warrior, DPM    [address]                                     [place], [place] [address]                Office [phone]  Fax [phone]   

## 2022-07-05 ENCOUNTER — Other Ambulatory Visit (INDEPENDENT_AMBULATORY_CARE_PROVIDER_SITE_OTHER): Payer: Self-pay | Admitting: Family

## 2022-08-16 ENCOUNTER — Ambulatory Visit (INDEPENDENT_AMBULATORY_CARE_PROVIDER_SITE_OTHER): Payer: Medicare Other | Admitting: Podiatry

## 2022-08-16 DIAGNOSIS — M79674 Pain in right toe(s): Secondary | ICD-10-CM

## 2022-08-16 DIAGNOSIS — M79675 Pain in left toe(s): Secondary | ICD-10-CM | POA: Diagnosis not present

## 2022-08-16 DIAGNOSIS — B351 Tinea unguium: Secondary | ICD-10-CM

## 2022-08-16 DIAGNOSIS — L989 Disorder of the skin and subcutaneous tissue, unspecified: Secondary | ICD-10-CM | POA: Diagnosis not present

## 2022-08-16 NOTE — Progress Notes (Signed)
   SUBJECTIVE Patient presents to office today for follow-up evaluation and routine foot care of toenails and symptomatic calluses especially to the right foot.  Patient states after last visit he did feel significant amount of relief but it slowly returned with the redevelopment of the calluses to the plantar aspect of the right foot.  No past medical history on file.  Past Surgical History:  Procedure Laterality Date   FOOT SURGERY     right   Allergies  Allergen Reactions   Shellfish Allergy Swelling     OBJECTIVE General Patient is awake, alert, and oriented x 3 and in no acute distress. Derm Skin is dry and supple bilateral. Negative open lesions or macerations. Remaining integument unremarkable. Nails are tender, long, thickened and dystrophic with subungual debris, consistent with onychomycosis, 1-5 bilateral. No signs of infection noted.  Hyperkeratotic preulcerative callus tissue noted to the bilateral feet right worse than the left Vasc  DP and PT pedal pulses palpable bilaterally. Temperature gradient within normal limits.  Neuro Epicritic and protective threshold sensation grossly intact bilaterally.  Musculoskeletal Exam No symptomatic pedal deformities noted bilateral. Muscular strength within normal limits.  ASSESSMENT 1.  Pain due to onychomycosis of toenails both 2.  Preulcerative calluses bilateral feet.  Right worse than the left  PLAN OF CARE 1. Patient evaluated today.  2. Instructed to maintain good pedal hygiene and foot care.  3. Mechanical debridement of nails 1-5 bilaterally performed using a nail nipper. Filed with dremel without incident.  4.  Excisional debridement of the hyperkeratotic preulcerative callus tissue was performed using a 312 scalpel without incident or bleeding as a courtesy for the patient.  5.  Patient wears OTC arch supports.  Continue.  Advised against going barefoot 6.  Return to clinic in 3 mos. for routine foot care   Edrick Kins, DPM [facility]  Dr. Edrick Kins, DPM    [address], [place] [address]                Office [phone]  Fax [phone]

## 2022-11-22 ENCOUNTER — Ambulatory Visit (INDEPENDENT_AMBULATORY_CARE_PROVIDER_SITE_OTHER): Payer: Medicare Other | Admitting: Podiatry

## 2022-11-22 DIAGNOSIS — Z91199 Patient's noncompliance with other medical treatment and regimen due to unspecified reason: Secondary | ICD-10-CM

## 2022-11-22 NOTE — Progress Notes (Signed)
   Complete physical exam  Patient: Tommy Barry   DOB: 07/31/2000   62 y.o. Male  MRN: [id number]  Subjective:    No chief complaint on file.   Tommy Barry is a 62 y.o. male who presents today for a complete physical exam. She reports consuming a {diet types:17450} diet. {types:19826} She generally feels {DESC; WELL/FAIRLY WELL/POORLY:18703}. She reports sleeping {DESC; WELL/FAIRLY WELL/POORLY:18703}. She {does/does not:200015} have additional problems to discuss today.    Most recent fall risk assessment:    04/07/2022   10:42 AM  Fall Risk   Falls in the past year? 0  Number falls in past yr: 0  Injury with Fall? 0  Risk for fall due to : No Fall Risks  Follow up Falls evaluation completed     Most recent depression screenings:    04/07/2022   10:42 AM 02/26/2021   10:46 AM  PHQ 2/9 Scores  PHQ - 2 Score 0 0  PHQ- 9 Score 5     {VISON DENTAL STD PSA (Optional):27386}  {History (Optional):23778}  Patient Care Team: Jessup, Joy, NP as PCP - General (Nurse Practitioner)   Outpatient Medications Prior to Visit  Medication Sig   fluticasone (FLONASE) 50 MCG/ACT nasal spray Place 2 sprays into both nostrils in the morning and at bedtime. After 7 days, reduce to once daily.   norgestimate-ethinyl estradiol (SPRINTEC 28) 0.25-35 MG-MCG tablet Take 1 tablet by mouth daily.   Nystatin POWD Apply liberally to affected area 2 times per day   spironolactone (ALDACTONE) 100 MG tablet Take 1 tablet (100 mg total) by mouth daily.   No facility-administered medications prior to visit.    ROS        Objective:     There were no vitals taken for this visit. {Vitals History (Optional):23777}  Physical Exam   No results found for any visits on 05/13/22. {Show previous labs (optional):23779}    Assessment & Plan:    Routine Health Maintenance and Physical Exam  Immunization History  Administered Date(s) Administered   DTaP 10/14/1999, 12/10/1999,  02/18/2000, 11/03/2000, 05/19/2004   Hepatitis A 03/15/2008, 03/21/2009   Hepatitis B 08/01/1999, 09/08/1999, 02/18/2000   HiB (PRP-OMP) 10/14/1999, 12/10/1999, 02/18/2000, 11/03/2000   IPV 10/14/1999, 12/10/1999, 08/08/2000, 05/19/2004   Influenza,inj,Quad PF,6+ Mos 06/21/2014   Influenza-Unspecified 09/20/2012   MMR 08/08/2001, 05/19/2004   Meningococcal Polysaccharide 03/20/2012   Pneumococcal Conjugate-13 11/03/2000   Pneumococcal-Unspecified 02/18/2000, 05/03/2000   Tdap 03/20/2012   Varicella 08/08/2000, 03/15/2008    Health Maintenance  Topic Date Due   HIV Screening  Never done   Hepatitis C Screening  Never done   INFLUENZA VACCINE  05/11/2022   PAP-Cervical Cytology Screening  05/13/2022 (Originally 07/30/2020)   PAP SMEAR-Modifier  05/13/2022 (Originally 07/30/2020)   TETANUS/TDAP  05/13/2022 (Originally 03/20/2022)   HPV VACCINES  Discontinued   COVID-19 Vaccine  Discontinued    Discussed health benefits of physical activity, and encouraged her to engage in regular exercise appropriate for her age and condition.  Problem List Items Addressed This Visit   None Visit Diagnoses     Annual physical exam    -  Primary   Cervical cancer screening       Need for Tdap vaccination          No follow-ups on file.     Joy Jessup, NP   

## 2022-12-17 ENCOUNTER — Other Ambulatory Visit (INDEPENDENT_AMBULATORY_CARE_PROVIDER_SITE_OTHER): Payer: Self-pay | Admitting: Internal Medicine

## 2023-08-08 DIAGNOSIS — E785 Hyperlipidemia, unspecified: Secondary | ICD-10-CM | POA: Diagnosis not present

## 2023-08-08 DIAGNOSIS — R2681 Unsteadiness on feet: Secondary | ICD-10-CM | POA: Diagnosis not present

## 2023-08-08 DIAGNOSIS — Z6824 Body mass index (BMI) 24.0-24.9, adult: Secondary | ICD-10-CM | POA: Diagnosis not present

## 2023-08-08 DIAGNOSIS — Z008 Encounter for other general examination: Secondary | ICD-10-CM | POA: Diagnosis not present

## 2023-08-08 DIAGNOSIS — F17210 Nicotine dependence, cigarettes, uncomplicated: Secondary | ICD-10-CM | POA: Diagnosis not present

## 2023-08-08 DIAGNOSIS — F325 Major depressive disorder, single episode, in full remission: Secondary | ICD-10-CM | POA: Diagnosis not present

## 2023-08-08 DIAGNOSIS — I10 Essential (primary) hypertension: Secondary | ICD-10-CM | POA: Diagnosis not present

## 2023-08-08 DIAGNOSIS — J439 Emphysema, unspecified: Secondary | ICD-10-CM | POA: Diagnosis not present

## 2023-12-22 ENCOUNTER — Encounter (INDEPENDENT_AMBULATORY_CARE_PROVIDER_SITE_OTHER): Payer: Self-pay

## 2024-01-17 ENCOUNTER — Other Ambulatory Visit (HOSPITAL_BASED_OUTPATIENT_CLINIC_OR_DEPARTMENT_OTHER)

## 2024-01-17 ENCOUNTER — Inpatient Hospital Stay
Admission: RE | Admit: 2024-01-17 | Discharge: 2024-01-17 | Disposition: A | Discharge status: Home / Self Care | Payer: Self-pay | Source: Ambulatory Visit | Attending: Internal Medicine | Admitting: Internal Medicine

## 2024-01-17 DIAGNOSIS — R0600 Dyspnea, unspecified: Secondary | ICD-10-CM

## 2024-01-17 DIAGNOSIS — R0609 Other forms of dyspnea: Secondary | ICD-10-CM

## 2024-01-17 LAB — PULMONARY FUNCTION TEST (COMBINED)
DL/VA- %Pred: 93 %
DL/VA- Actual: 4.45 ml/min/mmHg/L
DL/VA- Pred: 4.74 ml/min/mmHg/L
DLCOunc- %Pred: 94 %
DLCOunc- Actual: 30.42 ml/min/mmHg
DLCOunc- Pred: 32.33 ml/min/mmHg
ERV (Pre-Bronch) %Pred: 96 %
ERV (Pre-Bronch) Actual: 1.49 L
ERV (Pre-Bronch) Pred: 1.54 L
ERVSB (Pre-Actual): 1.49 L
FEF 25-75% (Pre-Bronch) %Pred: 60 %
FEF 25-75% (Pre-Bronch) Actual: 1.71 L/s
FEF 25-75% (Pre-Bronch) Pred: 2.82 L/s
FEF Max (Pre-Bronch) %Pred: 100 %
FEF Max (Pre-Bronch) Actual: 9.08 L/s
FEF Max (Pre-Bronch) Pred: 8.99 L/s
FEF2575 (Lower Limit of Normal): 1.34 L/s
FEF2575 (Standard Deviation): 1.07 L/s
FEFMax (Lower Limit of Normal): 6.67 L/s
FEFMax (Standard Deviation): 1.41 L/s
FEV1 (Lower Limit of Normal): 2.59 L
FEV1 (Pre-Bronch) %Pred: 92 %
FEV1 (Pre-Bronch) Actual: 3.2 L
FEV1 (Pre-Bronch) Pred: 3.48 L
FEV1 (Standard Deviation): 0.52 L
FEV1/FVC (Pre-Bronch) %Pred: 81 %
FEV1/FVC (Pre-Bronch) Actual: 63 %
FEV1/FVC (Pre-Bronch) Pred: 77 %
FVC (Lower Limit of Normal): 3.42 L
FVC (Pre-Bronch) %Pred: 112 %
FVC (Pre-Bronch) Actual: 5.09 L
FVC (Pre-Bronch) Pred: 4.52 L
FVC (Standard Deviation): 0.67 L
IC (Pre-Bronch) %Pred: 106 %
IC (Pre-Bronch) Actual: 3.27 L
IC (Pre-Bronch) Pred: 3.08 L
ICTLCPleth (Pre-Actual): 41 %
ICTLCSB (Pre-Actual): 47 %
PEF (Pre-Actual): 544.5 L/min
RV (Pleth) (Pre-Bronch) %Pred: 140 %
RV (Pleth) (Pre-Bronch) Actual: 3.23 L
RV (Pleth) (Pre-Bronch) Pred: 2.3 L
RV/TLC (Pleth) (Pre-Bronch) %Pred: 120 %
RV/TLC (Pleth) (Pre-Bronch) Actual: 40 %
RV/TLC (Pleth) (Pre-Bronch) Pred: 34 %
RVPleth (Lower Limit of Normal): 1.69 L
RVPleth (Standard Deviation): 0.37 L
RVSB (Pre-Actual): 1.87 L
RVTLCPl (Lower Limit of Normal): 26 %
RVTLCPl (Standard Deviation): 4 %
RVTLCSB (Pre-Actual): 27 %
SVCSB (Pre-Actual): 5.12 L
TGV (Pre-Bronch) %Pred: 128 %
TGV (Pre-Bronch) Actual: 4.72 L
TGV (Pre-Bronch) Pred: 3.67 L
TGVPleth (Lower Limit of Normal): 2.48 L
TGVPleth (Standard Deviation): 0.72 L
TLC (Pleth) (Pre-Bronch) %Pred: 114 %
TLC (Pleth) (Pre-Bronch) Actual: 7.99 L
TLC (Pleth) (Pre-Bronch) Pred: 7 L
TLCPleth (Lower Limit of Normal): 5.7 L
TLCPleth (Standard Deviation): 0.79 L
VA (% Predicted-Pre): 100 %
VA (Lower Limit of Normal): 5.45 L
VA (Pre-Actual): 6.84 L
VA (Standard Deviation): 0.83 L
VA- Pred: 6.82 L

## 2024-01-17 NOTE — Progress Notes (Signed)
 01/17/24 1158   Spirometry Pre and/or Post   Procedure Location and Type Diagnostic - Pre Only   Pre FVC 5.09 L   Pre FEV1 3.2 L   Pre FEV1/FVC % (Calculated) 63   Patient Effort Good   Adverse Reactions None   Lung Volumes   Method Body box   Procedure status Completed   Adverse Reactions None   DLCO   Procedure status Completed   Adverse Reactions None

## 2024-04-02 ENCOUNTER — Encounter: Payer: Self-pay | Admitting: Adult Health

## 2024-04-02 ENCOUNTER — Ambulatory Visit (INDEPENDENT_AMBULATORY_CARE_PROVIDER_SITE_OTHER): Payer: Self-pay | Admitting: Adult Health

## 2024-04-02 VITALS — BP 127/76 | HR 74 | Temp 97.7°F | Resp 18 | Ht 76.0 in | Wt 182.6 lb

## 2024-04-02 DIAGNOSIS — Z131 Encounter for screening for diabetes mellitus: Secondary | ICD-10-CM | POA: Diagnosis not present

## 2024-04-02 DIAGNOSIS — Z7689 Persons encountering health services in other specified circumstances: Secondary | ICD-10-CM | POA: Diagnosis not present

## 2024-04-02 DIAGNOSIS — H04123 Dry eye syndrome of bilateral lacrimal glands: Secondary | ICD-10-CM | POA: Diagnosis not present

## 2024-04-02 DIAGNOSIS — L84 Corns and callosities: Secondary | ICD-10-CM | POA: Diagnosis not present

## 2024-04-02 DIAGNOSIS — K5901 Slow transit constipation: Secondary | ICD-10-CM

## 2024-04-02 DIAGNOSIS — I10 Essential (primary) hypertension: Secondary | ICD-10-CM | POA: Diagnosis not present

## 2024-04-02 DIAGNOSIS — J449 Chronic obstructive pulmonary disease, unspecified: Secondary | ICD-10-CM

## 2024-04-02 DIAGNOSIS — R7303 Prediabetes: Secondary | ICD-10-CM | POA: Diagnosis not present

## 2024-04-02 DIAGNOSIS — Z1211 Encounter for screening for malignant neoplasm of colon: Secondary | ICD-10-CM

## 2024-04-02 DIAGNOSIS — Z113 Encounter for screening for infections with a predominantly sexual mode of transmission: Secondary | ICD-10-CM | POA: Diagnosis not present

## 2024-04-02 DIAGNOSIS — M25521 Pain in right elbow: Secondary | ICD-10-CM | POA: Diagnosis not present

## 2024-04-02 DIAGNOSIS — E785 Hyperlipidemia, unspecified: Secondary | ICD-10-CM | POA: Diagnosis not present

## 2024-04-02 DIAGNOSIS — Z1212 Encounter for screening for malignant neoplasm of rectum: Secondary | ICD-10-CM

## 2024-04-02 DIAGNOSIS — Z125 Encounter for screening for malignant neoplasm of prostate: Secondary | ICD-10-CM | POA: Diagnosis not present

## 2024-04-02 MED ORDER — LATANOPROST 0.005 % OP SOLN: 1.0000 [drp] | Freq: Every day | OPHTHALMIC | 3 refills | Status: DC

## 2024-04-02 MED ORDER — DOCUSATE SODIUM 100 MG PO CAPS: 100.0000 mg | ORAL_CAPSULE | Freq: Every day | ORAL | Status: AC

## 2024-04-02 MED ORDER — ALBUTEROL SULFATE HFA 108 (90 BASE) MCG/ACT IN AERS: 2.0000 | INHALATION_SPRAY | Freq: Four times a day (QID) | RESPIRATORY_TRACT | Status: AC | PRN

## 2024-04-02 NOTE — Progress Notes (Signed)
 [facility] clinic  Provider:  Jereld Serum DNP  Code Status:  Full Code  Goals of Care:     04/02/2024    1:19 PM  Advanced Directives  Does Patient Have a Medical Advance Directive? No  Would patient like information on creating a medical advance directive? No - Patient declined     Chief Complaint  Patient presents with   Establish Care    new patient    Discussed the use of AI scribe software for clinical note transcription with the patient, who gave verbal consent to proceed.  HPI: Patient is a 63 y.o. male seen today to establish care with [facility]. He was accompanied by his wife.  He has a history of hypertension, managed with amlodipine 5 mg daily and losartan 100 mg daily, with well-controlled blood pressure on this regimen.  His hyperlipidemia is treated with pravastatin 20 mg at bedtime. Cholesterol levels were last checked in 2015 and were normal at that time.  He has COPD, using Symbicort 160/4.5 mcg/ACT inhaler, one puff twice daily, and albuterol  108 mcg inhaler, one puff every six hours as needed for shortness of breath. He experiences occasional cough but no recent hospitalizations for respiratory issues.  He reports dry eyes and uses Xalatan  0.005% eye drops, one drop in both eyes daily. He has run out of his eye drops and has not seen an eye doctor in about three months.  He experiences constipation and takes Colace once daily. He has bowel movements every other day and attributes some difficulty to his lack of teeth, affecting his ability to chew food properly.  He has been experiencing right elbow pain for about two months, rating the pain as a 6 out of 10. The pain is not associated with swelling or trauma but is aggravated by work activities involving heavy lifting and pushing carts.  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 He reports pain and difficulty walking barefoot, especially on  hard surfaces.  He smokes about ten cigarettes a week, typically on Mondays and Fridays, and drinks about six beers a week on the same days. 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    No past medical history on file.  Past Surgical History:  Procedure Laterality Date   FOOT SURGERY     right    Allergies  Allergen Reactions   Shellfish Allergy Swelling    Outpatient Encounter Medications as of 04/02/2024  Medication Sig   amLODipine (NORVASC) 5 MG tablet Take 5 mg by mouth daily.   docusate sodium  (COLACE) 100 MG capsule Take 1 capsule (100 mg total) by mouth daily.   losartan (COZAAR) 100 MG tablet Take 100 mg by mouth daily.   pravastatin (PRAVACHOL) 20 MG tablet Take 20 mg by mouth at bedtime.   SYMBICORT 160-4.5 MCG/ACT inhaler SMARTSIG:2 Puff(s) Via Inhaler Morning-Evening   [DISCONTINUED] albuterol  (VENTOLIN  HFA) 108 (90 Base) MCG/ACT inhaler Inhale into the lungs.   [DISCONTINUED] latanoprost  (XALATAN ) 0.005 % ophthalmic solution SMARTSIG:1 Drop(s) Right Eye Every Evening   albuterol  (VENTOLIN  HFA) 108 (90 Base) MCG/ACT inhaler Inhale 2 puffs into the lungs every 6 (six) hours as needed for wheezing or shortness of breath.   latanoprost  (XALATAN ) 0.005 % ophthalmic solution Place 1 drop into both eyes daily.   [DISCONTINUED] ibuprofen (ADVIL) 800 MG tablet Take 800 mg by mouth every 8 (eight) hours as needed. (Patient not taking:  Reported on 04/02/2024)   No facility-administered encounter medications on file as of 04/02/2024.    Review of Systems:  Review of Systems  Constitutional:  Negative for activity change, appetite change and fever.  HENT:  Negative for sore throat.   Eyes: Negative.   Respiratory:  Positive for cough.   Cardiovascular:  Negative for chest pain and leg swelling.  Gastrointestinal:  Negative for abdominal distention, diarrhea and vomiting.  Genitourinary:  Negative for dysuria, frequency and urgency.  Skin:   Negative for color change.  Neurological:  Negative for dizziness and headaches.  Psychiatric/Behavioral:  Negative for behavioral problems and sleep disturbance. The patient is not nervous/anxious.     Health Maintenance  Topic Date Due   Medicare Annual Wellness (AWV)  Never done   HIV Screening  Never done   Hepatitis C Screening  Never done   Colonoscopy  Never done   COVID-19 Vaccine (1 - 2024-25 season) 04/18/2024 (Originally 06/12/2023)   Zoster Vaccines- Shingrix (1 of 2) 07/03/2024 (Originally 02/27/2010)   DTaP/Tdap/Td (1 - Tdap) 04/02/2025 (Originally 02/28/1979)   Pneumococcal Vaccine 64-34 Years old (1 of 2 - PCV) 04/02/2025 (Originally 02/28/1979)   INFLUENZA VACCINE  05/11/2024   HPV VACCINES  Aged Out   Meningococcal B Vaccine  Aged Out    Physical Exam: Vitals:   04/02/24 1320  BP: 127/76  Pulse: 74  Resp: 18  Temp: 97.7 F (36.5 C)  SpO2: 98%  Weight: 182 lb 9.6 oz (82.8 kg)  Height: 6' 4 (1.93 m)   Body mass index is 22.23 kg/m. Physical Exam Constitutional:      Appearance: Normal appearance.  HENT:     Head: Normocephalic and atraumatic.     Mouth/Throat:     Mouth: Mucous membranes are moist.   Eyes:     Conjunctiva/sclera: Conjunctivae normal.    Cardiovascular:     Rate and Rhythm: Normal rate and regular rhythm.     Pulses: Normal pulses.     Heart sounds: Normal heart sounds.  Pulmonary:     Effort: Pulmonary effort is normal.     Breath sounds: Normal breath sounds.  Abdominal:     General: Bowel sounds are normal.     Palpations: Abdomen is soft.   Musculoskeletal:        General: No swelling. Normal range of motion.     Cervical back: Normal range of motion.   Skin:    General: Skin is warm and dry.     Comments: Right foot has 3 calluses   Neurological:     General: No focal deficit present.     Mental Status: He is alert and oriented to person, place, and time.   Psychiatric:        Mood and Affect: Mood normal.         Behavior: Behavior normal.        Thought Content: Thought content normal.        Judgment: Judgment normal.     Labs reviewed: Basic Metabolic Panel: No results for input(s): NA, K, CL, CO2, GLUCOSE, BUN, CREATININE, CALCIUM, MG, PHOS, TSH in the last 8760 hours. Liver Function Tests: No results for input(s): AST, ALT, ALKPHOS, BILITOT, PROT, ALBUMIN in the last 8760 hours. No results for input(s): LIPASE, AMYLASE in the last 8760 hours. No results for input(s): AMMONIA in the last 8760 hours. CBC: No results for input(s): WBC, NEUTROABS, HGB, HCT, MCV, PLT in the last 8760 hours. Lipid Panel: No results  for input(s): CHOL, HDL, LDLCALC, TRIG, CHOLHDL, LDLDIRECT in the last 8760 hours. No results found for: HGBA1C  Procedures since last visit: No results found.  Assessment/Plan  1. Primary hypertension (Primary) -  Controlled with amlodipine and losartan.  - CBC with Differential/Platelets - Complete Metabolic Panel with eGFR - Hemoglobin A1C  2. Right elbow pain -  Chronic pain for two months, likely overuse injury. - Order X-ray of right elbow at [facility]. - DG Elbow Complete Right  3. Hyperlipidemia, unspecified hyperlipidemia type -  Managed with pravastatin. Plan to monitor lipid levels. - Order lipid panel. - Lipid panel - Hemoglobin A1C  4. Encounter to establish care -  established care with [facility]  5. Screen for STD (sexually transmitted disease) - Hep C Antibody - HIV antibody (with reflex)  6. Dry eyes -  Managed with Xalatan . - latanoprost  (XALATAN ) 0.005 % ophthalmic solution; Place 1 drop into both eyes daily.  Dispense: 2.5 mL; Refill: 3  7. Chronic obstructive pulmonary disease, unspecified COPD type (HCC) -  Managed with Symbicort and albuterol . Smokes 10 cigarettes per week. Advised smoking cessation. - albuterol  (VENTOLIN  HFA) 108 (90 Base) MCG/ACT inhaler; Inhale 2 puffs  into the lungs every 6 (six) hours as needed for wheezing or shortness of breath.  8. 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 Managed with Colace. - docusate sodium  (COLACE) 100 MG capsule; Take 1 capsule (100 mg total) by mouth daily.  9. Callus of foot -  Painful  calluses on right foot. Prefers different podiatrist. - Refer to an external podiatrist for evaluation and management. - Ambulatory referral to Podiatry  10. Screening for diabetes mellitus - Hemoglobin A1C  11. Screening for colorectal cancer - Ambulatory referral to Gastroenterology  12. Screening for prostate cancer - PSA      Labs/tests ordered:   CBC, CMP, A1C, lipid panel, PSA, x-ray right elbow   Return in about 2 weeks (around 04/16/2024).  Carmencita Cusic Medina-Vargas, NP

## 2024-04-03 ENCOUNTER — Ambulatory Visit: Payer: Self-pay | Admitting: Adult Health

## 2024-04-03 LAB — CBC WITH DIFFERENTIAL/PLATELET
Absolute Lymphocytes: 1404 {cells}/uL (ref 850–3900)
Absolute Monocytes: 418 {cells}/uL (ref 200–950)
Basophils Absolute: 48 {cells}/uL (ref 0–200)
Basophils Relative: 1.1 %
Eosinophils Absolute: 180 {cells}/uL (ref 15–500)
Eosinophils Relative: 4.1 %
HCT: 40.9 % (ref 38.5–50.0)
Hemoglobin: 13.5 g/dL (ref 13.2–17.1)
MCH: 30.1 pg (ref 27.0–33.0)
MCHC: 33 g/dL (ref 32.0–36.0)
MCV: 91.3 fL (ref 80.0–100.0)
MPV: 10.1 fL (ref 7.5–12.5)
Monocytes Relative: 9.5 %
Neutro Abs: 2350 {cells}/uL (ref 1500–7800)
Neutrophils Relative %: 53.4 %
Platelets: 206 10*3/uL (ref 140–400)
RBC: 4.48 10*6/uL (ref 4.20–5.80)
RDW: 12.6 % (ref 11.0–15.0)
Total Lymphocyte: 31.9 %
WBC: 4.4 10*3/uL (ref 3.8–10.8)

## 2024-04-03 LAB — HIV ANTIBODY (ROUTINE TESTING W REFLEX): HIV 1&2 Ab, 4th Generation: NONREACTIVE

## 2024-04-03 LAB — HEMOGLOBIN A1C
Hgb A1c MFr Bld: 5.7 % — ABNORMAL HIGH (ref <5.7)
Mean Plasma Glucose: 117 mg/dL
eAG (mmol/L): 6.5 mmol/L

## 2024-04-03 LAB — COMPLETE METABOLIC PANEL WITHOUT GFR
AG Ratio: 1.5 (calc) (ref 1.0–2.5)
ALT: 23 U/L (ref 9–46)
AST: 20 U/L (ref 10–35)
Albumin: 4.5 g/dL (ref 3.6–5.1)
Alkaline phosphatase (APISO): 59 U/L (ref 35–144)
BUN: 13 mg/dL (ref 7–25)
CO2: 27 mmol/L (ref 20–32)
Calcium: 9.7 mg/dL (ref 8.6–10.3)
Chloride: 104 mmol/L (ref 98–110)
Creat: 0.96 mg/dL (ref 0.70–1.35)
Globulin: 3.1 g/dL (ref 1.9–3.7)
Glucose, Bld: 93 mg/dL (ref 65–139)
Potassium: 4.2 mmol/L (ref 3.5–5.3)
Sodium: 138 mmol/L (ref 135–146)
Total Bilirubin: 0.8 mg/dL (ref 0.2–1.2)
Total Protein: 7.6 g/dL (ref 6.1–8.1)

## 2024-04-03 LAB — LIPID PANEL
Cholesterol: 160 mg/dL (ref <200)
HDL: 57 mg/dL (ref >=40)
LDL Cholesterol (Calc): 87 mg/dL
Non-HDL Cholesterol (Calc): 103 mg/dL (ref <130)
Total CHOL/HDL Ratio: 2.8 (calc) (ref <5.0)
Triglycerides: 69 mg/dL (ref <150)

## 2024-04-03 LAB — HEPATITIS C ANTIBODY: Hepatitis C Ab: NONREACTIVE

## 2024-04-03 LAB — PSA: PSA: 0.69 ng/mL (ref <=4.00)

## 2024-04-03 NOTE — Progress Notes (Signed)
-   A1c 5.7, ranging as prediabetic, recommending low-carb diet and exercise for at least 150 minutes/week -   No anemia Electrolytes, liver enzymes and lipid panel all normal-

## 2024-04-04 NOTE — Progress Notes (Signed)
-   Hepatitis C and HIV antibody negative

## 2024-04-23 ENCOUNTER — Encounter: Admitting: Adult Health

## 2024-04-23 DIAGNOSIS — I10 Essential (primary) hypertension: Secondary | ICD-10-CM

## 2024-04-23 DIAGNOSIS — E785 Hyperlipidemia, unspecified: Secondary | ICD-10-CM

## 2024-04-23 DIAGNOSIS — Z1211 Encounter for screening for malignant neoplasm of colon: Secondary | ICD-10-CM

## 2024-04-23 DIAGNOSIS — J449 Chronic obstructive pulmonary disease, unspecified: Secondary | ICD-10-CM

## 2024-04-27 NOTE — Progress Notes (Signed)
 This encounter was created in error - please disregard.

## 2024-05-21 DIAGNOSIS — F3342 Major depressive disorder, recurrent, in full remission: Secondary | ICD-10-CM | POA: Diagnosis not present

## 2024-05-21 DIAGNOSIS — H409 Unspecified glaucoma: Secondary | ICD-10-CM | POA: Diagnosis not present

## 2024-05-21 DIAGNOSIS — R7303 Prediabetes: Secondary | ICD-10-CM | POA: Diagnosis not present

## 2024-05-21 DIAGNOSIS — Z008 Encounter for other general examination: Secondary | ICD-10-CM | POA: Diagnosis not present

## 2024-06-06 ENCOUNTER — Encounter: Payer: Self-pay | Admitting: Adult Health

## 2024-08-14 ENCOUNTER — Other Ambulatory Visit: Payer: Self-pay | Admitting: Adult Health

## 2024-08-14 DIAGNOSIS — H04123 Dry eye syndrome of bilateral lacrimal glands: Secondary | ICD-10-CM

## 2024-10-23 ENCOUNTER — Ambulatory Visit: Payer: Self-pay | Admitting: Adult Health

## 2024-10-23 ENCOUNTER — Encounter: Payer: Self-pay | Admitting: Adult Health

## 2024-10-23 VITALS — BP 116/58 | HR 80 | Temp 98.2°F | Ht 76.0 in | Wt 191.0 lb

## 2024-10-23 DIAGNOSIS — R202 Paresthesia of skin: Secondary | ICD-10-CM | POA: Diagnosis not present

## 2024-10-23 DIAGNOSIS — J449 Chronic obstructive pulmonary disease, unspecified: Secondary | ICD-10-CM | POA: Diagnosis not present

## 2024-10-23 DIAGNOSIS — K5901 Slow transit constipation: Secondary | ICD-10-CM

## 2024-10-23 DIAGNOSIS — I10 Essential (primary) hypertension: Secondary | ICD-10-CM | POA: Diagnosis not present

## 2024-10-23 DIAGNOSIS — R7303 Prediabetes: Secondary | ICD-10-CM

## 2024-10-23 DIAGNOSIS — H04123 Dry eye syndrome of bilateral lacrimal glands: Secondary | ICD-10-CM

## 2024-10-23 DIAGNOSIS — E785 Hyperlipidemia, unspecified: Secondary | ICD-10-CM

## 2024-10-23 MED ORDER — PRAVASTATIN SODIUM 20 MG PO TABS: 20.0000 mg | ORAL_TABLET | Freq: Every day | ORAL | 1 refills | Status: AC

## 2024-10-23 MED ORDER — POLYETHYLENE GLYCOL 3350 17 GM/SCOOP PO POWD: 17.0000 g | Freq: Every day | ORAL | 2 refills | Status: AC

## 2024-10-23 MED ORDER — LATANOPROST 0.005 % OP SOLN: 1.0000 [drp] | Freq: Every day | OPHTHALMIC | 3 refills | Status: AC

## 2024-10-23 NOTE — Progress Notes (Signed)
 "  [facility]  Provider:  Jereld Serum DNP  Code Status:  Full Code  Goals of Care:     10/23/2024    3:27 PM  Advanced Directives  Does Patient Have a Medical Advance Directive? No  Would patient like information on creating a medical advance directive? No - Patient declined     Chief Complaint  Patient presents with   Tingling    Right thumb and left foot(ongoing for about a month)     Discussed the use of AI scribe software for clinical note transcription with the patient, who gave verbal consent to proceed.  HPI: Patient is a 64 y.o. male seen today for an acute visit for tingling on right thumb and left foot.  He has been experiencing tingling in his right thumb and left big toe for about a month. The sensation is described as tingling and sometimes burning, particularly when pressure is applied. The tingling is persistent and occasionally feels like a shock when massaged. The left toes sometimes aches.  He has a history of prediabetes with a recent A1c of 5.7. 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  He experiences constipation, describing his bowel movements as infrequent and difficult to pass. He attributes this to difficulty chewing due to dental issues, which affects his ability to eat fibrous foods. His diet includes vegetables, but he struggles with digestion due to his dental condition.  His current medications include losartan 100 mg daily, amlodipine 5 mg daily, Symbicort 160/4.5 mcg two puffs twice daily, albuterol  as needed, and docusate 100 mg daily. He also uses Xalatan  eye drops for dry eyes.  Socially, he drinks alcohol two to three times a week, consuming about two beers each time. He smokes occasionally, typically when drinking, and reports smoking up  to a pack a day on those occasions.        History reviewed. No pertinent past medical history.  Past Surgical History:  Procedure Laterality Date   FOOT SURGERY     right    Allergies[1]  Outpatient Encounter Medications as of 10/23/2024  Medication Sig   albuterol  (VENTOLIN  HFA) 108 (90 Base) MCG/ACT inhaler Inhale 2 puffs into the lungs every 6 (six) hours as needed for wheezing or shortness of breath.   amLODipine (NORVASC) 5 MG tablet Take 5 mg by mouth daily.   docusate sodium  (COLACE) 100 MG capsule Take 1 capsule (100 mg total) by mouth daily.   losartan (COZAAR) 100 MG tablet Take 100 mg by mouth daily.   polyethylene glycol powder (MIRALAX ) 17 GM/SCOOP powder Take 17 g by mouth daily. Dissolve 1 capful (17g) in 4-8 ounces of liquid and take by mouth daily.   SYMBICORT 160-4.5 MCG/ACT inhaler SMARTSIG:2 Puff(s) Via Inhaler Morning-Evening   [DISCONTINUED] latanoprost  (XALATAN ) 0.005 % ophthalmic solution Place 1 drop into both eyes daily.   [DISCONTINUED] pravastatin  (PRAVACHOL ) 20 MG tablet Take 20 mg by mouth at bedtime.   latanoprost  (XALATAN ) 0.005 % ophthalmic solution Place 1 drop into both eyes daily.   pravastatin  (PRAVACHOL ) 20 MG tablet Take 1 tablet (20 mg total) by mouth at bedtime.   No facility-administered encounter medications on file as of 10/23/2024.    Review of Systems:  Review of Systems  Constitutional:  Negative for activity  change, appetite change and fever.  HENT:  Negative for sore throat.   Eyes: Negative.   Cardiovascular:  Negative for chest pain and leg swelling.  Gastrointestinal:  Positive for constipation. Negative for abdominal distention, diarrhea and vomiting.  Genitourinary:  Negative for dysuria, frequency and urgency.  Musculoskeletal:        Right thumb tingling and left toes ache  Skin:  Negative for color change.  Neurological:  Negative for dizziness and headaches.       Tingling on right thumb and left foot   Psychiatric/Behavioral:  Negative for behavioral problems and sleep disturbance. The patient is not nervous/anxious.     Health Maintenance  Topic Date Due   Medicare Annual Wellness (AWV)  Never done   Pneumococcal Vaccine: 50+ Years (1 of 2 - PCV) Never done   Colonoscopy  Never done   Zoster Vaccines- Shingrix (1 of 2) Never done   Influenza Vaccine  Never done   COVID-19 Vaccine (1 - 2025-26 season) Never done   DTaP/Tdap/Td (1 - Tdap) 04/02/2025 (Originally 02/28/1980)   Hepatitis C Screening  Completed   HIV Screening  Completed   Hepatitis B Vaccines 19-59 Average Risk  Aged Out   HPV VACCINES  Aged Out   Meningococcal B Vaccine  Aged Out    Physical Exam: Vitals:   10/23/24 1528  BP: (!) 116/58  Pulse: 80  Temp: 98.2 F (36.8 C)  TempSrc: Temporal  SpO2: 96%  Weight: 191 lb (86.6 kg)  Height: 6' 4 (1.93 m)   Body mass index is 23.25 kg/m. Physical Exam Constitutional:      Appearance: Normal appearance.  HENT:     Head: Normocephalic and atraumatic.     Mouth/Throat:     Mouth: Mucous membranes are moist.  Eyes:     Conjunctiva/sclera: Conjunctivae normal.  Cardiovascular:     Rate and Rhythm: Normal rate and regular rhythm.     Pulses: Normal pulses.     Heart sounds: Normal heart sounds.  Pulmonary:     Effort: Pulmonary effort is normal.     Breath sounds: Normal breath sounds.  Abdominal:     General: Bowel sounds are normal.     Palpations: Abdomen is soft.  Musculoskeletal:        General: No swelling. Normal range of motion.     Cervical back: Normal range of motion.  Skin:    General: Skin is warm and dry.  Neurological:     General: No focal deficit present.     Mental Status: He is alert and oriented to person, place, and time.  Psychiatric:        Mood and Affect: Mood normal.        Behavior: Behavior normal.        Thought Content: Thought content normal.        Judgment: Judgment normal.     Labs reviewed: Basic Metabolic  Panel: Recent Labs    04/02/24 1402  NA 138  K 4.2  CL 104  CO2 27  GLUCOSE 93  BUN 13  CREATININE 0.96  CALCIUM 9.7   Liver Function Tests: Recent Labs    04/02/24 1402  AST 20  ALT 23  BILITOT 0.8  PROT 7.6   No results for input(s): LIPASE, AMYLASE in the last 8760 hours. No results for input(s): AMMONIA in the last 8760 hours. CBC: Recent Labs    04/02/24 1402  WBC 4.4  NEUTROABS 2,350  HGB 13.5  HCT 40.9  MCV 91.3  PLT 206   Lipid Panel: Recent Labs    04/02/24 1402  CHOL 160  HDL 57  LDLCALC 87  TRIG 69  CHOLHDL 2.8   Lab Results  Component Value Date   HGBA1C 5.7 (H) 04/02/2024    Procedures since last visit: No results found.  Assessment/Plan  1. Tingling (Primary) -  tingling of right thumb, and left toes ache, works as a field seismologist at [employer] -  right-handed, pushes several carts all at one time -  left foot is size 14 and right is size 13 -  instructed to use size 14 shoe on left foot -  - Advised warm compress application on affected areas. - Uric Acid - Vitamin B12 - CBC with Differential/Platelets  2. Chronic obstructive pulmonary disease, unspecified COPD type (HCC) -  managed with Symbicort and albuterol  inhalers.  - Continue Symbicort 160/4.5 mcg, two puffs twice daily. - Continue albuterol  as needed, two puffs every six hours.  3. Primary hypertension -  Blood pressure well-controlled at 116/58 mmHg with current medication regimen. - Continue losartan 100 mg daily. - Continue amlodipine 5 mg daily. - Basic Metabolic Panel  4. Slow transit constipation -  with hard stools requiring straining. Difficulty chewing due to dental issues may contribute to poor digestion. - Prescribed Miralax , one scoop daily. -  continue Colace 100 mg daily - Advised dietary modifications to include soft foods or smoothies. - polyethylene glycol powder (MIRALAX ) 17 GM/SCOOP powder; Take 17 g by mouth daily. Dissolve  1 capful (17g) in 4-8 ounces of liquid and take by mouth daily.  Dispense: 238 g; Refill: 2  5. Dry eyes -  Managed with Xalatan  eye drops. - Continue Xalatan  eye drops as prescribed. - latanoprost  (XALATAN ) 0.005 % ophthalmic solution; Place 1 drop into both eyes daily.  Dispense: 2.5 mL; Refill: 3  6. Prediabetes Lab Results  Component Value Date   HGBA1C 5.7 (H) 04/02/2024    -  diet-controlled  7. Hyperlipidemia, unspecified hyperlipidemia type Lab Results  Component Value Date   CHOL 160 04/02/2024   HDL 57 04/02/2024   LDLCALC 87 04/02/2024   TRIG 69 04/02/2024   CHOLHDL 2.8 04/02/2024    - pravastatin  (PRAVACHOL ) 20 MG tablet; Take 1 tablet (20 mg total) by mouth at bedtime.  Dispense: 90 tablet; Refill: 1      Labs/tests ordered:  CBC, BMP, vitamin B 12 level, uric acid   Return if symptoms worsen or fail to improve.  Hulen Mandler Medina-Vargas, NP     [1]  Allergies Allergen Reactions   Shellfish Allergy Swelling   "

## 2024-10-24 ENCOUNTER — Other Ambulatory Visit: Payer: Self-pay | Admitting: Adult Health

## 2024-10-24 DIAGNOSIS — H04123 Dry eye syndrome of bilateral lacrimal glands: Secondary | ICD-10-CM

## 2024-10-24 DIAGNOSIS — K5901 Slow transit constipation: Secondary | ICD-10-CM

## 2024-10-24 DIAGNOSIS — E785 Hyperlipidemia, unspecified: Secondary | ICD-10-CM

## 2024-10-24 LAB — CBC WITH DIFFERENTIAL/PLATELET
Absolute Lymphocytes: 1312 {cells}/uL (ref 850–3900)
Absolute Monocytes: 513 {cells}/uL (ref 200–950)
Basophils Absolute: 29 {cells}/uL (ref 0–200)
Basophils Relative: 0.7 %
Eosinophils Absolute: 160 {cells}/uL (ref 15–500)
Eosinophils Relative: 3.9 %
HCT: 39.6 % (ref 39.4–51.1)
Hemoglobin: 13.3 g/dL (ref 13.2–17.1)
MCH: 30.1 pg (ref 27.0–33.0)
MCHC: 33.6 g/dL (ref 31.6–35.4)
MCV: 89.6 fL (ref 81.4–101.7)
MPV: 10.1 fL (ref 7.5–12.5)
Monocytes Relative: 12.5 %
Neutro Abs: 2087 {cells}/uL (ref 1500–7800)
Neutrophils Relative %: 50.9 %
Platelets: 213 Thousand/uL (ref 140–400)
RBC: 4.42 Million/uL (ref 4.20–5.80)
RDW: 12.1 % (ref 11.0–15.0)
Total Lymphocyte: 32 %
WBC: 4.1 Thousand/uL (ref 3.8–10.8)

## 2024-10-24 LAB — BASIC METABOLIC PANEL WITH GFR
BUN: 15 mg/dL (ref 7–25)
CO2: 29 mmol/L (ref 20–32)
Calcium: 9.8 mg/dL (ref 8.6–10.3)
Chloride: 103 mmol/L (ref 98–110)
Creat: 1.01 mg/dL (ref 0.70–1.35)
Glucose, Bld: 78 mg/dL (ref 65–139)
Potassium: 4 mmol/L (ref 3.5–5.3)
Sodium: 140 mmol/L (ref 135–146)
eGFR: 83 mL/min/1.73m2

## 2024-10-24 LAB — VITAMIN B12: Vitamin B-12: 519 pg/mL (ref 200–1100)

## 2024-10-24 LAB — URIC ACID: Uric Acid, Serum: 5.8 mg/dL (ref 4.0–8.0)

## 2024-10-24 NOTE — Telephone Encounter (Signed)
 Copied from CRM [id number]. Topic: Clinical - Prescription Issue >> Oct 24, 2024  2:05 PM Mercer PEDLAR wrote: Reason for CRM: Patient is requesting for prescriptions below to be resent to pharmacy because they do not have them.   latanoprost  (XALATAN ) 0.005 % ophthalmic solution polyethylene glycol powder (MIRALAX ) 17 GM/SCOOP powder pravastatin  (PRAVACHOL ) 20 MG tablet  [facility] - [place], [place] - [address] [place] [address] Phone: [phone]  Fax: [phone]

## 2024-10-25 ENCOUNTER — Other Ambulatory Visit: Payer: Self-pay | Admitting: Adult Health

## 2024-10-25 NOTE — Telephone Encounter (Signed)
 Please advise if ok to refill. This has never been prescribed by you it was prescribed by another provider 2023.

## 2024-10-26 ENCOUNTER — Ambulatory Visit: Payer: Self-pay | Admitting: Adult Health

## 2024-10-26 NOTE — Progress Notes (Signed)
-   No anemia -   Electrolytes, vitamin B12 level, uric as seen, all normal -  recommending to buy a bigger shoe for the left foot

## 2025-04-15 ENCOUNTER — Ambulatory Visit: Admitting: Adult Health
